# Patient Record
Sex: Female | Born: 1945 | Hispanic: Yes | Marital: Married | Smoking: Never smoker
Health system: Southern US, Community
[De-identification: ages and names within clinical notes are randomized; demographics above are authoritative.]

## PROBLEM LIST (undated history)

## (undated) DIAGNOSIS — K219 Gastro-esophageal reflux disease without esophagitis: Secondary | ICD-10-CM

## (undated) DIAGNOSIS — I471 Supraventricular tachycardia, unspecified: Secondary | ICD-10-CM

## (undated) DIAGNOSIS — E785 Hyperlipidemia, unspecified: Secondary | ICD-10-CM

## (undated) DIAGNOSIS — D447 Neoplasm of uncertain behavior of aortic body and other paraganglia: Secondary | ICD-10-CM

## (undated) DIAGNOSIS — M810 Age-related osteoporosis without current pathological fracture: Secondary | ICD-10-CM

## (undated) DIAGNOSIS — I10 Essential (primary) hypertension: Secondary | ICD-10-CM

## (undated) DIAGNOSIS — F39 Unspecified mood [affective] disorder: Secondary | ICD-10-CM

## (undated) DIAGNOSIS — D446 Neoplasm of uncertain behavior of carotid body: Secondary | ICD-10-CM

## (undated) DIAGNOSIS — G459 Transient cerebral ischemic attack, unspecified: Secondary | ICD-10-CM

## (undated) DIAGNOSIS — D329 Benign neoplasm of meninges, unspecified: Secondary | ICD-10-CM

## (undated) DIAGNOSIS — G453 Amaurosis fugax: Secondary | ICD-10-CM

## (undated) HISTORY — DX: Neoplasm of uncertain behavior of aortic body and other paraganglia: D44.7

## (undated) HISTORY — DX: Gastro-esophageal reflux disease without esophagitis: K21.9

## (undated) HISTORY — DX: Amaurosis fugax: G45.3

## (undated) HISTORY — DX: Supraventricular tachycardia: I47.1

## (undated) HISTORY — DX: Age-related osteoporosis without current pathological fracture: M81.0

## (undated) HISTORY — PX: IR TRANSCATH/EMBOLIZ: IMG695

## (undated) HISTORY — DX: Unspecified mood (affective) disorder: F39

## (undated) HISTORY — PX: HERNIA REPAIR: SHX51

## (undated) HISTORY — DX: Neoplasm of uncertain behavior of carotid body: D44.6

## (undated) HISTORY — DX: Supraventricular tachycardia, unspecified: I47.10

## (undated) HISTORY — DX: Transient cerebral ischemic attack, unspecified: G45.9

## (undated) HISTORY — DX: Hyperlipidemia, unspecified: E78.5

## (undated) HISTORY — PX: CHOLECYSTECTOMY: SHX55

---

## 2011-09-19 DIAGNOSIS — I10 Essential (primary) hypertension: Secondary | ICD-10-CM | POA: Insufficient documentation

## 2013-09-19 DIAGNOSIS — K219 Gastro-esophageal reflux disease without esophagitis: Secondary | ICD-10-CM | POA: Insufficient documentation

## 2014-10-02 DIAGNOSIS — F32A Depression, unspecified: Secondary | ICD-10-CM | POA: Insufficient documentation

## 2014-10-02 DIAGNOSIS — F419 Anxiety disorder, unspecified: Secondary | ICD-10-CM | POA: Insufficient documentation

## 2015-02-15 DIAGNOSIS — E7800 Pure hypercholesterolemia, unspecified: Secondary | ICD-10-CM | POA: Insufficient documentation

## 2016-06-13 DIAGNOSIS — D329 Benign neoplasm of meninges, unspecified: Secondary | ICD-10-CM | POA: Insufficient documentation

## 2017-12-21 ENCOUNTER — Other Ambulatory Visit: Payer: Self-pay

## 2017-12-21 ENCOUNTER — Emergency Department (HOSPITAL_BASED_OUTPATIENT_CLINIC_OR_DEPARTMENT_OTHER)
Admission: EM | Admit: 2017-12-21 | Discharge: 2017-12-21 | Disposition: A | Discharge status: Home / Self Care | Payer: Medicare Other | Attending: Emergency Medicine | Admitting: Emergency Medicine

## 2017-12-21 ENCOUNTER — Emergency Department (HOSPITAL_BASED_OUTPATIENT_CLINIC_OR_DEPARTMENT_OTHER): Payer: Medicare Other

## 2017-12-21 ENCOUNTER — Encounter (HOSPITAL_BASED_OUTPATIENT_CLINIC_OR_DEPARTMENT_OTHER): Payer: Self-pay

## 2017-12-21 DIAGNOSIS — G453 Amaurosis fugax: Secondary | ICD-10-CM | POA: Insufficient documentation

## 2017-12-21 DIAGNOSIS — I10 Essential (primary) hypertension: Secondary | ICD-10-CM | POA: Diagnosis not present

## 2017-12-21 DIAGNOSIS — H53132 Sudden visual loss, left eye: Secondary | ICD-10-CM | POA: Diagnosis present

## 2017-12-21 HISTORY — DX: Essential (primary) hypertension: I10

## 2017-12-21 HISTORY — DX: Benign neoplasm of meninges, unspecified: D32.9

## 2017-12-21 LAB — I-STAT CHEM 8, ED
BUN: 8 mg/dL (ref 6–20)
CALCIUM ION: 1.11 mmol/L — AB (ref 1.15–1.40)
Chloride: 105 mmol/L (ref 101–111)
Creatinine, Ser: 0.5 mg/dL (ref 0.44–1.00)
Glucose, Bld: 81 mg/dL (ref 65–99)
HEMATOCRIT: 38 % (ref 36.0–46.0)
HEMOGLOBIN: 12.9 g/dL (ref 12.0–15.0)
Potassium: 3.9 mmol/L (ref 3.5–5.1)
SODIUM: 141 mmol/L (ref 135–145)
TCO2: 24 mmol/L (ref 22–32)

## 2017-12-21 LAB — TROPONIN I

## 2017-12-21 LAB — CBC
HCT: 38.8 % (ref 36.0–46.0)
Hemoglobin: 12.7 g/dL (ref 12.0–15.0)
MCH: 31.8 pg (ref 26.0–34.0)
MCHC: 32.7 g/dL (ref 30.0–36.0)
MCV: 97.2 fL (ref 78.0–100.0)
PLATELETS: 166 10*3/uL (ref 150–400)
RBC: 3.99 MIL/uL (ref 3.87–5.11)
RDW: 13.9 % (ref 11.5–15.5)
WBC: 5.4 10*3/uL (ref 4.0–10.5)

## 2017-12-21 LAB — DIFFERENTIAL
BASOS ABS: 0 10*3/uL (ref 0.0–0.1)
Basophils Relative: 0 %
EOS ABS: 0.2 10*3/uL (ref 0.0–0.7)
Eosinophils Relative: 3 %
LYMPHS PCT: 42 %
Lymphs Abs: 2.3 10*3/uL (ref 0.7–4.0)
Monocytes Absolute: 0.5 10*3/uL (ref 0.1–1.0)
Monocytes Relative: 9 %
NEUTROS PCT: 46 %
Neutro Abs: 2.4 10*3/uL (ref 1.7–7.7)

## 2017-12-21 LAB — CBG MONITORING, ED: Glucose-Capillary: 75 mg/dL (ref 65–99)

## 2017-12-21 LAB — APTT: APTT: 34 s (ref 24–36)

## 2017-12-21 LAB — TSH: TSH: 2.275 u[IU]/mL (ref 0.350–4.500)

## 2017-12-21 LAB — PROTIME-INR
INR: 0.98
PROTHROMBIN TIME: 12.9 s (ref 11.4–15.2)

## 2017-12-21 NOTE — ED Provider Notes (Signed)
[facility] EMERGENCY DEPARTMENT Provider Note   CSN: [id number] Arrival date & time: 12/21/17  1127     History   Chief Complaint Chief Complaint  Patient presents with  . Visual Field Change    HPI [name] is a 72 y.o. female.  Patient is a 72 year old female presenting with new onset transient left eye blindness for 10 minutes.  PMH significant for HTN, depression, meningioma.  Patient is Spanish-speaking only and history was obtained by daughter who is present.  Patient was at home sitting on into bed yesterday evening when she suddenly experienced complete black-out of her left eye.  She also had a generalized weakness during the onset.  Blindness resolved after approximately 10 minutes.  Patient's daughter who was present during the event noted patient is back to baseline without dysarthria or confused state following the event.  Due to resolution of symptoms, patient's daughter waited till this morning to contact PCP who instructed patient to go to ED for further evaluation for possible stroke.  Patient states she is back at her baseline but does have some left eye blurriness to vision.  She does not wear contacts or glasses.  She denies prior history of stroke or TIA.  Daughter states patient does have a known meningioma on the right side of her brain.  She has taken her medications this morning for her blood pressure prior to arrival.  She is a non-smoker and denies alcohol or illicit drug use.  She denies fevers or chills, chest pain, shortness of breath, nausea or vomiting, abdominal pain, headache, motor weakness or decreased sensation, eye pain, neck stiffness, or photophobia.      Past Medical History:  Diagnosis Date  . Hypertension   . Meningioma ([place])     There are no active problems to display for this patient.   Past Surgical History:  Procedure Laterality Date  . CHOLECYSTECTOMY      OB History    No data available       Home  Medications    Prior to Admission medications   Not on File    Family History No family history on file.  Social History Social History   Tobacco Use  . Smoking status: Never Smoker  . Smokeless tobacco: Never Used  Substance Use Topics  . Alcohol use: No    Frequency: Never  . Drug use: No     Allergies   Patient has no known allergies.   Review of Systems Review of Systems  Constitutional: Negative for chills, fatigue and fever.  HENT: Negative for congestion, ear pain and sore throat.   Eyes: Positive for visual disturbance. Negative for photophobia, pain, discharge, redness and itching.  Respiratory: Negative for cough and shortness of breath.   Cardiovascular: Negative for chest pain and palpitations.  Gastrointestinal: Negative for abdominal pain and vomiting.  Genitourinary: Negative for dysuria and hematuria.  Musculoskeletal: Negative for arthralgias and back pain.  Skin: Negative for color change and rash.  Neurological: Positive for weakness. Negative for dizziness, tremors, seizures, syncope, facial asymmetry, speech difficulty, light-headedness, numbness and headaches.  Psychiatric/Behavioral: Negative for confusion. The patient is not nervous/anxious.   All other systems reviewed and are negative.    Physical Exam Updated Vital Signs BP (!) 161/65   Pulse (!) 57   Temp 98.4 F (36.9 C) (Oral)   Resp 15   Ht 5' (1.524 m)   Wt 68 kg (150 lb)   SpO2 95%   BMI 29.29  kg/m   Physical Exam  Constitutional: She is oriented to person, place, and time. She appears well-developed and well-nourished. No distress.  HENT:  Head: Normocephalic and atraumatic.  Eyes: Conjunctivae are normal.  Neck: Normal range of motion. Neck supple.  Cardiovascular: Normal rate and regular rhythm.  No murmur heard. Pulmonary/Chest: Effort normal and breath sounds normal. No respiratory distress.  Abdominal: Soft. There is no tenderness.  Musculoskeletal: She exhibits  no edema.  Neurological: She is alert and oriented to person, place, and time.  CNII-XII intact, no dysarthria, fine motor intact  Skin: Skin is warm and dry. Capillary refill takes less than 2 seconds.  Psychiatric: She has a normal mood and affect. Her behavior is normal.  Nursing note and vitals reviewed.    ED Treatments / Results  Labs (all labs ordered are listed, but only abnormal results are displayed) Labs Reviewed  I-STAT CHEM 8, ED - Abnormal; Notable for the following components:      Result Value   Calcium, Ion 1.11 (*)    All other components within normal limits  PROTIME-INR  APTT  CBC  DIFFERENTIAL  TROPONIN I  TSH  CBG MONITORING, ED    EKG  EKG Interpretation None       Radiology Ct Head Wo Contrast  Result Date: 12/21/2017 CLINICAL DATA:  Total loss of vision and then blinking to left eye last night. Symptoms for 10 minutes. There is also left-sided weakness. EXAM: CT HEAD WITHOUT CONTRAST TECHNIQUE: Contiguous axial images were obtained from the base of the skull through the vertex without intravenous contrast. COMPARISON:  Brain MRI from [facility] 11/29/2016 FINDINGS: Brain: No findings along the optic pathways to explain symptoms of visual acuity. No infarct, hemorrhage, or hydrocephalus. There is cerebral volume loss that is mild for age. Right frontal pole convexity extra-axial appearing mass with stippled calcification measuring up to 17 mm, most consistent with meningioma. There is local mass effect without visible edema. Vascular: No hyperdense vessel or unexpected calcification. Skull: Normal. Negative for fracture or focal lesion. Sinuses/Orbits: Negative.  Left orbit is incompletely visualized. IMPRESSION: 1. No acute finding or explanation for visual symptoms. 2. ~17 mm right anterior frontal meningioma without detectable change from 11/29/2016. No evidence of brain edema. Electronically Signed   By: Monte Fantasia M.D.   On: 12/21/2017 12:34     Procedures Procedures (including critical care time)  Medications Ordered in ED Medications - No data to display   Initial Impression / Assessment and Plan / ED Course  I have reviewed the triage vital signs and the nursing notes.  Pertinent labs & imaging results that were available during my care of the patient were reviewed by me and considered in my medical decision making (see chart for details).    Patient is a 72 year old female presenting with new onset transient left eye blindness for 10 minutes.  PMH significant for HTN, depression, meningioma.  Patient is Spanish-speaking only and history was obtained by daughter who is present.  Patient presenting with history of transient left eye blindness evening prior to arrival.  She continues to have some blurry vision limited to left eye.  Vital signs significant for bradycardia 57, hypertensive at 183/64 improved to 161/65 without use of medications breathing 98% on room air.  Patient is afebrile.  Complete neuro exam performed without focal deficits.  Visual acuity appears to be intact.  Pupils are equal and round and reactive to light and accommodation.  Dysarthria is absent.  No meningeal signs.  We will proceed with CT head without contrast.  Differential includes stroke/TIA, amaurosis fugax.  CT head without contrast significant for 17 mm right anterior frontal meningioma without changes from prior imaging on 11/29/2016.  No evidence of acute findings.  Suspect patient sustained a left-sided amaurosis fugax.  Discussed with patient and daughter regarding need for PCP follow-up and establish with a neurologist.  Reviewed return precautions.  Patient and daughter in agreement discharge.  Final Clinical Impressions(s) / ED Diagnoses   Final diagnoses:  None    ED Discharge Orders    None       Dayton Bing, DO 12/21/17 1305    Jola Schmidt, MD 12/21/17 [phone]

## 2017-12-21 NOTE — ED Triage Notes (Signed)
Per daughter pt with total loss of vision then blinking to left eye last night approx 9pm-lasted approx 10 min-also felt weakness-denies pain-NAD-steady gait-daughter with pt/interpreting

## 2017-12-21 NOTE — Discharge Instructions (Signed)
Follow-up with your primary care physician within the week.  Continue taking her medications as instructed.  You should be evaluated by a neurologist due to your symptoms.  I have given you the contact information for [facility] neurology.

## 2017-12-29 DIAGNOSIS — G459 Transient cerebral ischemic attack, unspecified: Secondary | ICD-10-CM | POA: Insufficient documentation

## 2018-05-25 DIAGNOSIS — D446 Neoplasm of uncertain behavior of carotid body: Secondary | ICD-10-CM | POA: Insufficient documentation

## 2018-09-14 HISTORY — PX: OTHER SURGICAL HISTORY: SHX169

## 2019-01-04 IMAGING — CT CT HEAD W/O CM
3 series · 16 of 47 positions shown, 19 images · non-contrast
Comparison: Brain MRI from [facility] 11/29/2016

CLINICAL DATA: Total loss of vision and then blinking to left eye
last night. Symptoms for 10 minutes. There is also left-sided
weakness.

EXAM:
CT HEAD WITHOUT CONTRAST
TECHNIQUE: Contiguous axial images were obtained from the base of the skull
through the vertex without intravenous contrast.

[Series 2: head (person_name) (person_name) · axial · 0.39mm/px · z∈[-165,-35]mm · 10 of 32 slices shown, 13 images]
[im 3/32  brain]
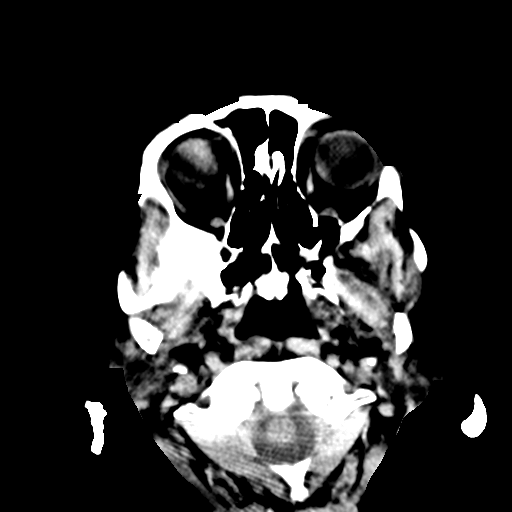
[im 3/32  bone]
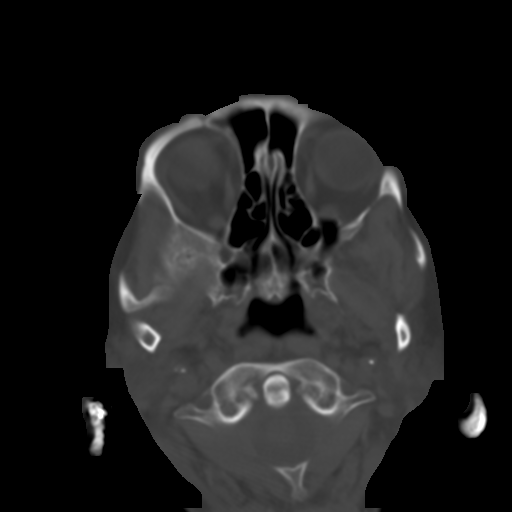
[im 6/32  brain]
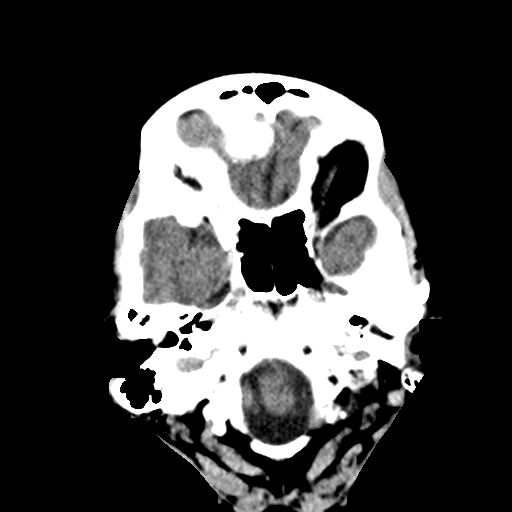
[im 9/32  brain]
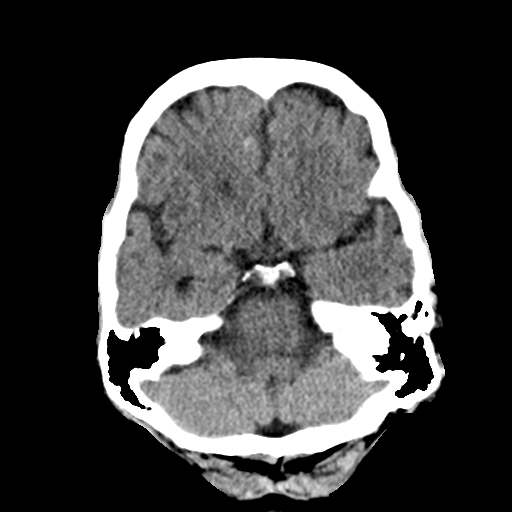
[im 11/32  brain]
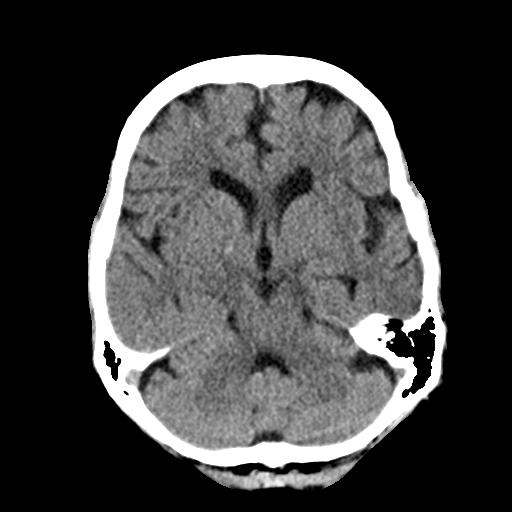
[im 14/32  brain]
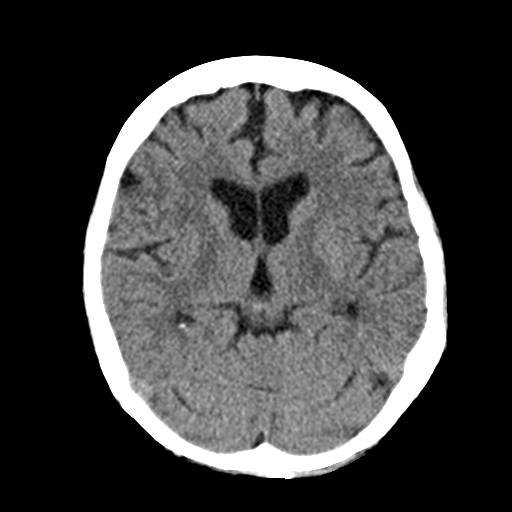
[im 14/32  bone]
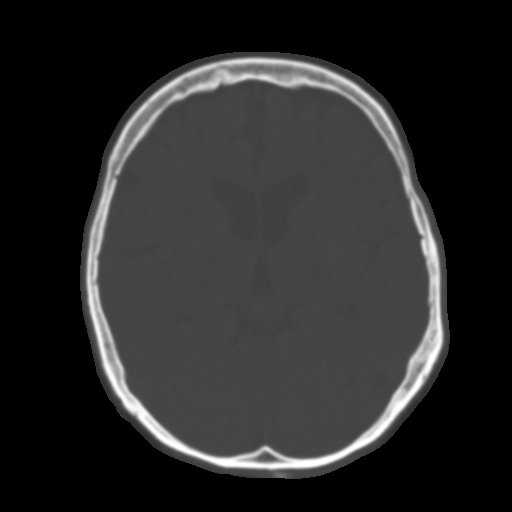
[im 18/32  brain]
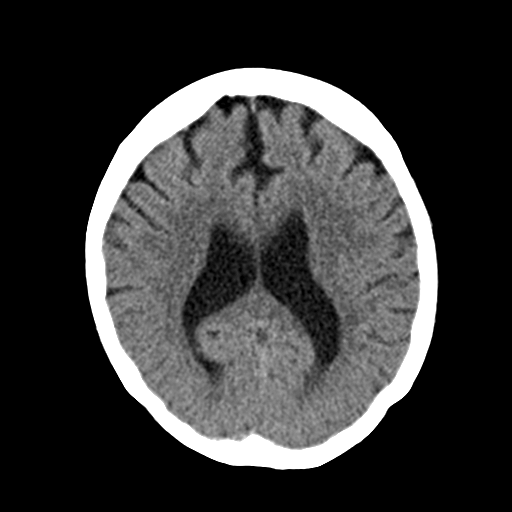
[im 21/32  brain]
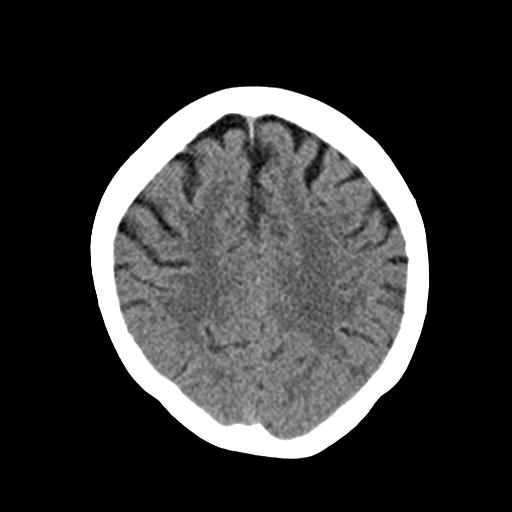
[im 24/32  brain]
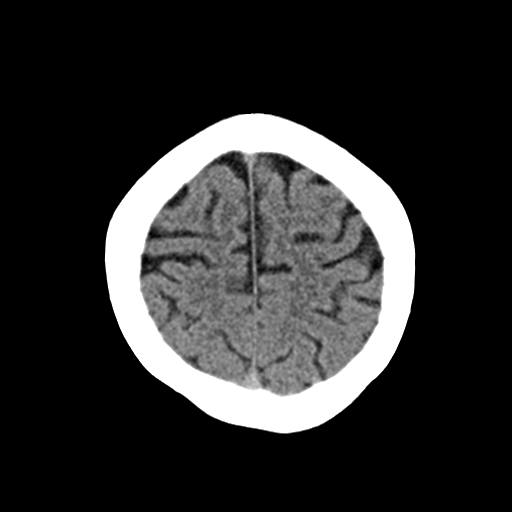
[im 26/32  brain]
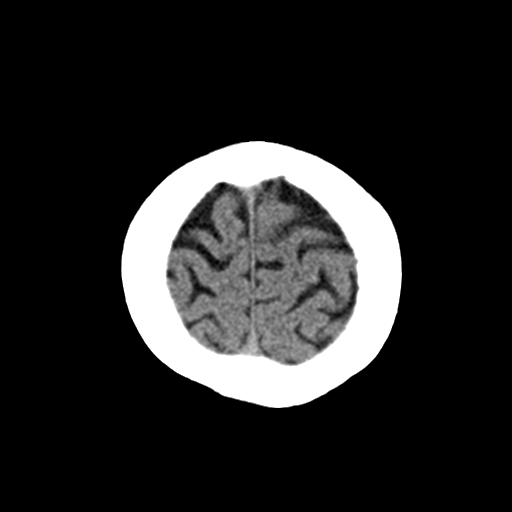
[im 26/32  bone]
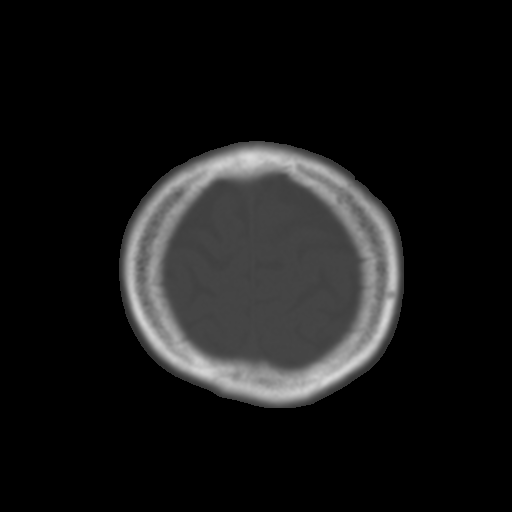
[im 29/32  brain]
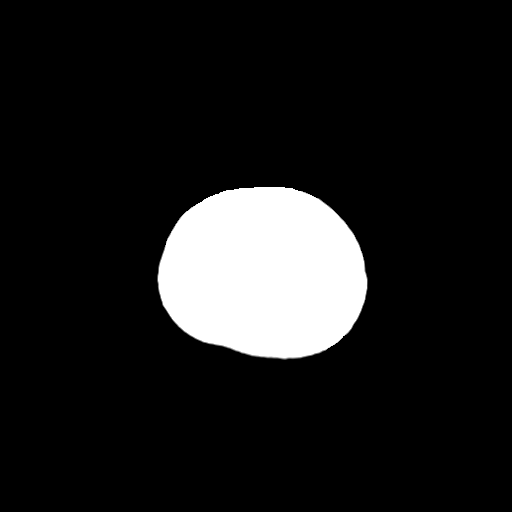

[Series 3: coronal soft · coronal · 0.30mm/px · 3 of 63 slices shown]
[im 21/63  brain]
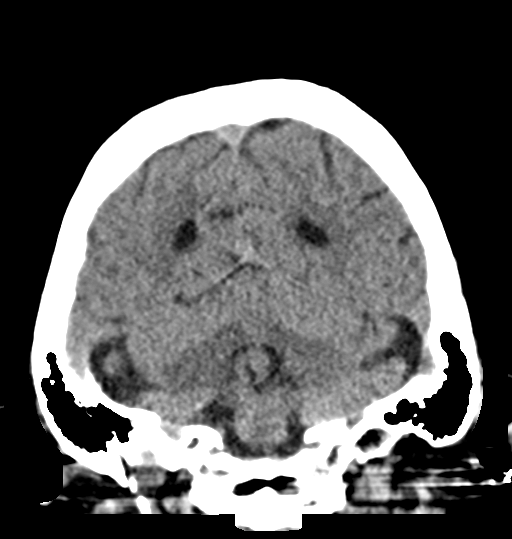
[im 28/63  brain]
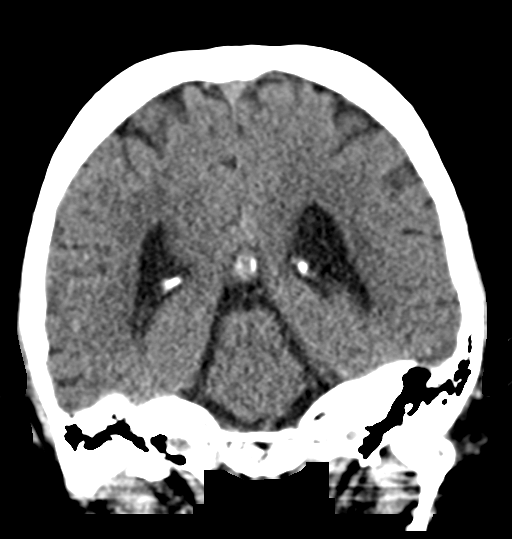
[im 35/63  brain]
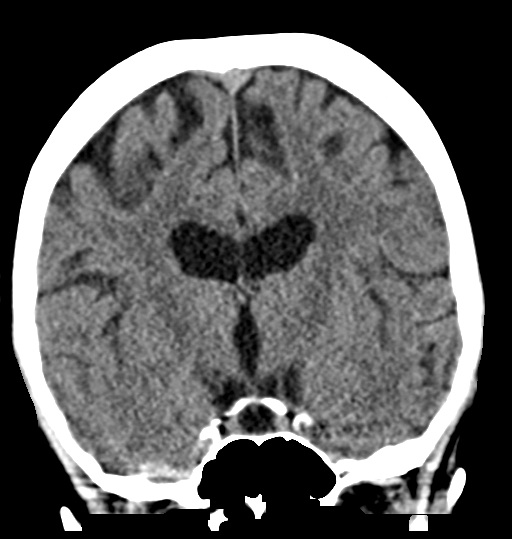

[Series 5: sag soft · sagittal · 0.31mm/px · 3 of 49 slices shown]
[im 17/49  brain]
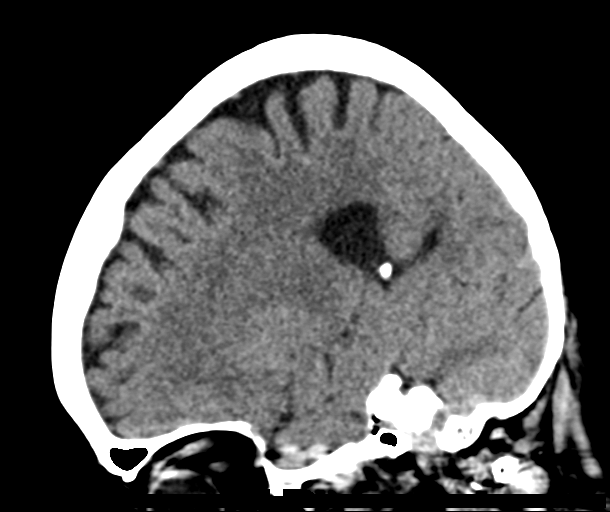
[im 25/49  brain]
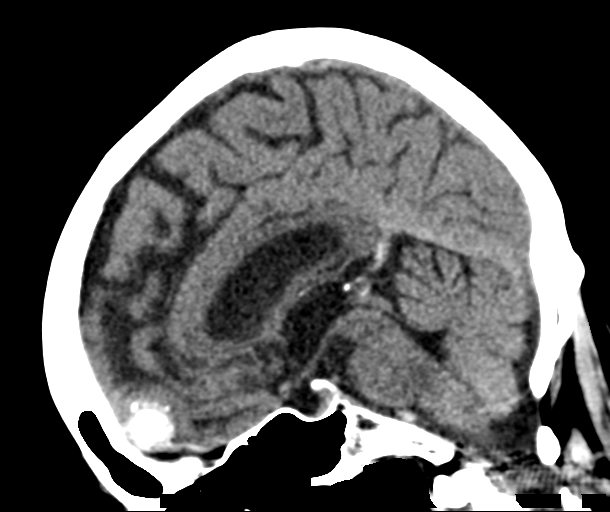
[im 33/49  brain]
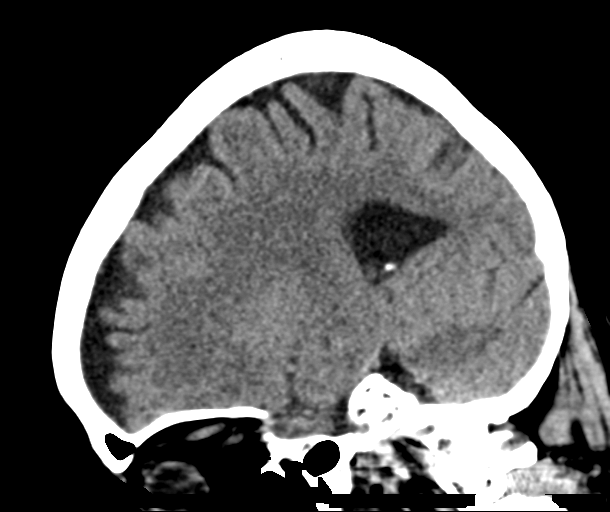

[16 of 47 positions shown; findings below may reference images not displayed]

FINDINGS: Brain: No findings along the optic pathways to explain symptoms of
visual acuity. No infarct, hemorrhage, or hydrocephalus. There is
cerebral volume loss that is mild for age. Right frontal pole
convexity extra-axial appearing mass with stippled calcification
measuring up to 17 mm, most consistent with meningioma. There is
local mass effect without visible edema.

Vascular: No hyperdense vessel or unexpected calcification.

Skull: Normal. Negative for fracture or focal lesion.

Sinuses/Orbits: Negative.  Left orbit is incompletely visualized.
IMPRESSION: 1. No acute finding or explanation for visual symptoms.
2. 
17 mm right anterior frontal meningioma without detectable
change from 11/29/2016. No evidence of brain edema.

## 2019-01-16 DIAGNOSIS — I471 Supraventricular tachycardia: Secondary | ICD-10-CM | POA: Insufficient documentation

## 2019-05-06 DIAGNOSIS — R600 Localized edema: Secondary | ICD-10-CM | POA: Insufficient documentation

## 2021-10-04 LAB — HM DEXA SCAN

## 2021-11-02 DIAGNOSIS — M810 Age-related osteoporosis without current pathological fracture: Secondary | ICD-10-CM | POA: Insufficient documentation

## 2022-07-17 ENCOUNTER — Telehealth: Payer: Self-pay | Admitting: Internal Medicine

## 2022-07-17 DIAGNOSIS — F39 Unspecified mood [affective] disorder: Secondary | ICD-10-CM | POA: Insufficient documentation

## 2022-07-17 DIAGNOSIS — G479 Sleep disorder, unspecified: Secondary | ICD-10-CM | POA: Insufficient documentation

## 2022-07-17 NOTE — Telephone Encounter (Signed)
Pt would like to know if ok per Dr. Larose Kells to est new pt with him, pt was recommended by Molli Posey who is currently a pt of Dr Larose Kells. Pt speaks spanish and prefers a provider that speaks spanish. Pt tel [phone] (Mr Molli Posey tel phone number- for updates)

## 2022-07-17 NOTE — Telephone Encounter (Signed)
Please advise once you return to office.

## 2022-07-22 NOTE — Telephone Encounter (Signed)
That is okay.  Thank you 

## 2022-07-22 NOTE — Telephone Encounter (Signed)
Okay to schedule NP appt. Thank you.  

## 2022-07-23 NOTE — Telephone Encounter (Signed)
Pt is scheduled for NP with [facility]. Done

## 2022-07-31 DIAGNOSIS — D447 Neoplasm of uncertain behavior of aortic body and other paraganglia: Secondary | ICD-10-CM | POA: Insufficient documentation

## 2022-07-31 DIAGNOSIS — G453 Amaurosis fugax: Secondary | ICD-10-CM | POA: Insufficient documentation

## 2022-08-07 ENCOUNTER — Encounter: Payer: Self-pay | Admitting: General Practice

## 2022-08-15 ENCOUNTER — Encounter: Payer: Self-pay | Admitting: Internal Medicine

## 2022-08-15 ENCOUNTER — Ambulatory Visit (INDEPENDENT_AMBULATORY_CARE_PROVIDER_SITE_OTHER): Payer: 59 | Admitting: Internal Medicine

## 2022-08-15 VITALS — BP 122/68 | HR 53 | Temp 98.1°F | Resp 16 | Ht 59.0 in | Wt 156.4 lb

## 2022-08-15 DIAGNOSIS — Z09 Encounter for follow-up examination after completed treatment for conditions other than malignant neoplasm: Secondary | ICD-10-CM | POA: Insufficient documentation

## 2022-08-15 DIAGNOSIS — I10 Essential (primary) hypertension: Secondary | ICD-10-CM | POA: Diagnosis not present

## 2022-08-15 DIAGNOSIS — F329 Major depressive disorder, single episode, unspecified: Secondary | ICD-10-CM | POA: Diagnosis not present

## 2022-08-15 DIAGNOSIS — E559 Vitamin D deficiency, unspecified: Secondary | ICD-10-CM

## 2022-08-15 DIAGNOSIS — E7800 Pure hypercholesterolemia, unspecified: Secondary | ICD-10-CM

## 2022-08-15 DIAGNOSIS — Z23 Encounter for immunization: Secondary | ICD-10-CM

## 2022-08-15 DIAGNOSIS — K219 Gastro-esophageal reflux disease without esophagitis: Secondary | ICD-10-CM

## 2022-08-15 DIAGNOSIS — M810 Age-related osteoporosis without current pathological fracture: Secondary | ICD-10-CM | POA: Diagnosis not present

## 2022-08-15 NOTE — Patient Instructions (Addendum)
Continue the present medications.  Vaccines I recommend:  Covid booster Tdap (tetanus)  Check the  blood pressure regularly BP GOAL is between 110/65 and  135/85. If it is consistently higher or lower, let me know  HEALTHY SLEEP  Consider melatonin over-the-counter at nighttime, 4 to 8 mg  Sleep hygiene: Basic rules for a good night's sleep  Sleep only as much as you need to feel rested and then get out of bed  Keep a regular sleep schedule  Avoid forcing sleep  Exercise regularly for at least 20 minutes, preferably 4 to 5 hours before bedtime  Avoid caffeinated beverages after lunch  Avoid alcohol near bedtime: no "night cap"  Avoid smoking, especially in the evening  Do not go to bed hungry  Adjust bedroom environment  Avoid prolonged use of light-emitting screens before bedtime   Deal with your worries before bedtime      GO TO THE LAB : Get the blood work     GO TO [place], [place] back for a checkup in 2 to 3 months

## 2022-08-15 NOTE — Assessment & Plan Note (Signed)
New patient Labs  from 07/15/2022: 07/15/2022: Potassium 3.3, creatinine 0.5, LFTs normal.  Sed rate normal. CBC normal.  HTN: Reports a long history of hypertension, on multiple medications including amlodipine, chlorthalidone, Lasix, magnesium, metoprolol and Aldactone. She has been seen by nephrology and cardiology in the past. BP today is good, in the ambulatory setting reports that from time to time BP go as high as 180. Plan: Recheck BMP, RF meds as needed. High cholesterol: On atorvastatin, check FLP. Depression: Lost a brother to a MI last month, her sister is ill, her mother is 6 years old and not doing well.  Additionally few weeks ago she came back from visiting [place] and unexpectedly her husband's family moved him to [place]  w/o notifying her reason why is now living in [place] with her daughter and son-in-law. Not interested in antidepressants.  Denies suicidal ideas. Insomnia: Rec when melatonin. GERD: Sees GI regularly, had a barium study February 2023, some evidence of dysmotility, has EGD scheduled for December. Osteoporosis: Per chart review, last Reclast injection on 03/17/2022 Vitamin D deficiency: On OTCs, checking levels Preventive care: Flu shot today, COVID-vaccine recommended. Recommend to keep her current to specialists for now. RTC 2 to 3 months.

## 2022-08-15 NOTE — Progress Notes (Signed)
Subjective:    Patient ID: [place] [name], female    DOB: [birth year], 76 y.o.   MRN: [id number]  DOS:  08/15/2022 Type of visit - description: New patient, referred by his son-in-law.  The patient used to live in [name], medical care was with doctors at [name]. [name] to a number of circumstances she is now in [name].  Extensive chart review. I assessed all her chronic medical problems  In general feeling well physically except for persistent right knee pain. She admits to some depression, but does not like to take any medication. Having some issues with insomnia.  Review of Systems See above   Past Medical History:  Diagnosis Date   Amaurosis fugax    Carotid body tumor (HCC)    GERD (gastroesophageal reflux disease)    Hyperlipidemia    Hypertension    Meningioma (HCC)    Mood disorder (HCC)    Osteoporosis    Paraganglioma (HCC)    SVT (supraventricular tachycardia) (HCC)    TIA (transient ischemic attack)     Past Surgical History:  Procedure Laterality Date   CHOLECYSTECTOMY     HERNIA REPAIR     IR TRANSCATH/EMBOLIZ     OTHER SURGICAL HISTORY  09/14/2018   carotid lesion   Social History   Socioeconomic History   Marital status: Married    Spouse name: Not on file   Number of children: 5   Years of education: Not on file   Highest education level: Not on file  Occupational History   Not on file  Tobacco Use   Smoking status: Never   Smokeless tobacco: Never  Substance and Sexual Activity   Alcohol use: No   Drug use: No   Sexual activity: Not on file  Other Topics Concern   Not on file  Social History Narrative   Original from [name]   Moved to [name] 1999, moved  [name] 06-2022 to live w/ daughter and son in [name])    Married, don't have children in common , husband living in [name] as of 07-2022    2 son, 2 daughters   72 a son   Social Determinants of Radio broadcast assistant Strain: Not on file  Food  Insecurity: Not on file  Transportation Needs: Not on file  Physical Activity: Not on file  Stress: Not on file  Social Connections: Not on file  Intimate Partner Violence: Not on file   Family History  Problem Relation Age of Onset   CAD Father 14   Colon cancer Sister    Breast cancer Sister    Head & neck cancer Brother    CAD Brother 73       MI    Current Outpatient Medications  Medication Instructions   amLODipine (NORVASC) 10 mg, Oral, Daily   atorvastatin (LIPITOR) 20 mg, Oral, Daily   chlorthalidone (HYGROTON) 25 MG tablet 1 tablet, Oral, Daily   Cholecalciferol 2,000 Units, Oral, Daily   diclofenac Sodium (VOLTAREN) 2 g, Topical, 3 times daily PRN   furosemide (LASIX) 20 mg, Oral, Daily   magnesium oxide (MAG-OX) 400 MG tablet 1 tablet, Oral, Daily   metoprolol succinate (TOPROL-XL) 50 mg, Oral, 2 times daily   omeprazole (PRILOSEC) 20 mg, Oral, Daily   spironolactone (ALDACTONE) 25 mg, Oral, Daily   zoledronic acid (RECLAST) 5 mg, Intravenous,  Once       Objective:   Physical Exam BP 122/68  Pulse (!) 53   Temp 98.1 F (36.7 C) (Oral)   Resp 16   Ht '4\' 11"'$  (1.499 m)   Wt 156 lb 6 oz (70.9 kg)   SpO2 96%   BMI 31.58 kg/m  General:   Well developed, NAD, BMI noted. HEENT:  Normocephalic . Face symmetric, atraumatic Lungs:  CTA B Normal respiratory effort, no intercostal retractions, no accessory muscle use. Heart: RRR,  no murmur.  Lower extremities: Has a brace and the R knee. Skin: Not pale. Not jaundice Neurologic:  alert & oriented X3.  Speech normal, gait appropriate for age and unassisted Psych--  Cognition and judgment appear intact.  Cooperative with normal attention span and concentration.  Behavior appropriate. No anxious or depressed appearing.      Assessment     ASSESSMENT  (new patient, 07-2022, refer by his son-in-law) HTN High cholesterol Depression Osteoporosis Vitamin D deficiency GERD, abnormal barium study, EGD  recommended 10-2022 Carotid body tumor s/p excision H/o meningioma anterior cranial fossa , declined surgery, elected observation H/o palpitations FH CAD, Breast and colon cancer   PLAN New patient Labs  from 07/15/2022: 07/15/2022: Potassium 3.3, creatinine 0.5, LFTs normal.  Sed rate normal. CBC normal.  HTN: Reports a long history of hypertension, on multiple medications including amlodipine, chlorthalidone, Lasix, magnesium, metoprolol and Aldactone. She has been seen by nephrology and cardiology in the past. BP today is good, in the ambulatory setting reports that from time to time BP go as high as 180. Plan: Recheck BMP, RF meds as needed. High cholesterol: On atorvastatin, check FLP. Depression: Lost a brother to a MI last month, her sister is ill, her mother is 72 years old and not doing well.  Additionally few weeks ago she came back from visiting [name] and unexpectedly her husband's family moved him to [place]  w/o notifying her reason why is now living in [name] with her daughter and son-in-law. Not interested in antidepressants.  Denies suicidal ideas. Insomnia: Rec when melatonin. GERD: Sees GI regularly, had a barium study February 2023, some evidence of dysmotility, has EGD scheduled for December. Osteoporosis: Per chart review, last Reclast injection on 03/17/2022 Vitamin D deficiency: On OTCs, checking levels Preventive care: Flu shot today, COVID-vaccine recommended. Recommend to keep her current to specialists for now. RTC 2 to 3 months.

## 2022-08-16 LAB — BASIC METABOLIC PANEL
BUN/Creatinine Ratio: 31 (calc) — ABNORMAL HIGH (ref 6–22)
BUN: 16 mg/dL (ref 7–25)
CO2: 31 mmol/L (ref 20–32)
Calcium: 9.9 mg/dL (ref 8.6–10.4)
Chloride: 94 mmol/L — ABNORMAL LOW (ref 98–110)
Creat: 0.51 mg/dL — ABNORMAL LOW (ref 0.60–1.00)
Glucose, Bld: 106 mg/dL — ABNORMAL HIGH (ref 65–99)
Potassium: 3.7 mmol/L (ref 3.5–5.3)
Sodium: 135 mmol/L (ref 135–146)

## 2022-08-16 LAB — VITAMIN D 25 HYDROXY (VIT D DEFICIENCY, FRACTURES): Vit D, 25-Hydroxy: 36 ng/mL (ref 30–100)

## 2022-08-16 LAB — LIPID PANEL
Cholesterol: 146 mg/dL (ref <200)
HDL: 63 mg/dL (ref >=50)
LDL Cholesterol (Calc): 64 mg/dL (calc)
Non-HDL Cholesterol (Calc): 83 mg/dL (calc) (ref <130)
Total CHOL/HDL Ratio: 2.3 (calc) (ref <5.0)
Triglycerides: 104 mg/dL (ref <150)

## 2022-08-27 ENCOUNTER — Encounter: Payer: Self-pay | Admitting: Internal Medicine

## 2022-11-14 ENCOUNTER — Encounter: Payer: Self-pay | Admitting: Internal Medicine

## 2022-11-14 ENCOUNTER — Ambulatory Visit: Payer: 59 | Admitting: Internal Medicine

## 2022-11-27 ENCOUNTER — Telehealth: Payer: Self-pay | Admitting: Internal Medicine

## 2022-11-27 NOTE — Telephone Encounter (Signed)
Pt's daughter was wanting to know if we could waive her no show fee. Her mother's mother passed away and she flew straight to [place] and has not come back yet. They stated money is very tight and she does not have the means to pay $50. She will reschedule when she has come back.

## 2022-11-28 NOTE — Telephone Encounter (Signed)
Yes, sorry to hear about her loss

## 2022-11-28 NOTE — Telephone Encounter (Signed)
Please advise 

## 2023-09-21 ENCOUNTER — Emergency Department (HOSPITAL_COMMUNITY): Payer: 59

## 2023-09-21 ENCOUNTER — Telehealth: Payer: Self-pay

## 2023-09-21 ENCOUNTER — Emergency Department (HOSPITAL_COMMUNITY)
Admission: EM | Admit: 2023-09-21 | Discharge: 2023-09-22 | Disposition: A | Discharge status: Home / Self Care | Payer: 59

## 2023-09-21 DIAGNOSIS — R109 Unspecified abdominal pain: Secondary | ICD-10-CM | POA: Diagnosis not present

## 2023-09-21 DIAGNOSIS — R531 Weakness: Secondary | ICD-10-CM | POA: Diagnosis not present

## 2023-09-21 DIAGNOSIS — Z7982 Long term (current) use of aspirin: Secondary | ICD-10-CM | POA: Diagnosis not present

## 2023-09-21 DIAGNOSIS — Z20822 Contact with and (suspected) exposure to covid-19: Secondary | ICD-10-CM | POA: Diagnosis not present

## 2023-09-21 DIAGNOSIS — Z79899 Other long term (current) drug therapy: Secondary | ICD-10-CM | POA: Insufficient documentation

## 2023-09-21 DIAGNOSIS — R4781 Slurred speech: Secondary | ICD-10-CM | POA: Insufficient documentation

## 2023-09-21 DIAGNOSIS — I10 Essential (primary) hypertension: Secondary | ICD-10-CM | POA: Insufficient documentation

## 2023-09-21 DIAGNOSIS — R200 Anesthesia of skin: Secondary | ICD-10-CM | POA: Insufficient documentation

## 2023-09-21 DIAGNOSIS — R110 Nausea: Secondary | ICD-10-CM | POA: Diagnosis not present

## 2023-09-21 DIAGNOSIS — R42 Dizziness and giddiness: Secondary | ICD-10-CM | POA: Diagnosis present

## 2023-09-21 DIAGNOSIS — N83202 Unspecified ovarian cyst, left side: Secondary | ICD-10-CM | POA: Insufficient documentation

## 2023-09-21 LAB — URINALYSIS, ROUTINE W REFLEX MICROSCOPIC
Bacteria, UA: NONE SEEN
Bilirubin Urine: NEGATIVE
Glucose, UA: NEGATIVE mg/dL
Hgb urine dipstick: NEGATIVE
Ketones, ur: NEGATIVE mg/dL
Nitrite: NEGATIVE
Protein, ur: NEGATIVE mg/dL
Specific Gravity, Urine: >1.046 — ABNORMAL HIGH (ref 1.005–1.030)
pH: 6 (ref 5.0–8.0)

## 2023-09-21 LAB — HEPATIC FUNCTION PANEL
ALT: 25 U/L (ref 0–44)
AST: 26 U/L (ref 15–41)
Albumin: 3.8 g/dL (ref 3.5–5.0)
Alkaline Phosphatase: 50 U/L (ref 38–126)
Bilirubin, Direct: 0.2 mg/dL (ref 0.0–0.2)
Indirect Bilirubin: 0.8 mg/dL (ref 0.3–0.9)
Total Bilirubin: 1 mg/dL (ref <1.2)
Total Protein: 7.1 g/dL (ref 6.5–8.1)

## 2023-09-21 LAB — CBC
HCT: 40.4 % (ref 36.0–46.0)
Hemoglobin: 13.2 g/dL (ref 12.0–15.0)
MCH: 31.7 pg (ref 26.0–34.0)
MCHC: 32.7 g/dL (ref 30.0–36.0)
MCV: 96.9 fL (ref 80.0–100.0)
Platelets: 138 10*3/uL — ABNORMAL LOW (ref 150–400)
RBC: 4.17 MIL/uL (ref 3.87–5.11)
RDW: 12.8 % (ref 11.5–15.5)
WBC: 8.1 10*3/uL (ref 4.0–10.5)
nRBC: 0 % (ref 0.0–0.2)

## 2023-09-21 LAB — BASIC METABOLIC PANEL
Anion gap: 11 (ref 5–15)
BUN: 14 mg/dL (ref 8–23)
CO2: 26 mmol/L (ref 22–32)
Calcium: 9.3 mg/dL (ref 8.9–10.3)
Chloride: 101 mmol/L (ref 98–111)
Creatinine, Ser: 0.52 mg/dL (ref 0.44–1.00)
GFR, Estimated: >60 mL/min (ref >60)
Glucose, Bld: 95 mg/dL (ref 70–99)
Potassium: 4.4 mmol/L (ref 3.5–5.1)
Sodium: 138 mmol/L (ref 135–145)

## 2023-09-21 LAB — RESP PANEL BY RT-PCR (RSV, FLU A&B, COVID)  RVPGX2
Influenza A by PCR: NEGATIVE
Influenza B by PCR: NEGATIVE
Resp Syncytial Virus by PCR: NEGATIVE
SARS Coronavirus 2 by RT PCR: NEGATIVE

## 2023-09-21 LAB — LIPASE, BLOOD: Lipase: 41 U/L (ref 11–51)

## 2023-09-21 LAB — TROPONIN I (HIGH SENSITIVITY)
Troponin I (High Sensitivity): 6 ng/L (ref <18)
Troponin I (High Sensitivity): 8 ng/L (ref <18)

## 2023-09-21 LAB — TSH: TSH: 2.081 u[IU]/mL (ref 0.350–4.500)

## 2023-09-21 LAB — CK: Total CK: 70 U/L (ref 38–234)

## 2023-09-21 LAB — CBG MONITORING, ED: Glucose-Capillary: 95 mg/dL (ref 70–99)

## 2023-09-21 MED ORDER — ONDANSETRON HCL 4 MG/2ML IJ SOLN
4.0000 mg | Freq: Once | INTRAMUSCULAR | Status: AC
  Administered 2023-09-21: 4 mg via INTRAVENOUS
  Filled 2023-09-21: qty 2

## 2023-09-21 MED ORDER — HYDRALAZINE HCL 20 MG/ML IJ SOLN
5.0000 mg | Freq: Once | INTRAMUSCULAR | Status: DC
  Filled 2023-09-21: qty 1

## 2023-09-21 MED ORDER — MECLIZINE HCL 25 MG PO TABS
25.0000 mg | ORAL_TABLET | Freq: Once | ORAL | Status: AC
  Administered 2023-09-21: 25 mg via ORAL
  Filled 2023-09-21: qty 1

## 2023-09-21 MED ORDER — IOHEXOL 350 MG/ML SOLN
75.0000 mL | Freq: Once | INTRAVENOUS | Status: AC | PRN
  Administered 2023-09-21: 75 mL via INTRAVENOUS

## 2023-09-21 NOTE — ED Triage Notes (Signed)
Pt to ED via [facility] from home. Pt c/o dizziness and generalized weakness since yesterday at 3pm. Family reports slurred speech yesterday as well. Pt has hx of stroke. Pt c/o nausea and diarrhea. Pt also states her BP was elevated this morning and took her BP meds and stated it came down some and some symptoms resolved after taking medication. Pt does endorse L hand numbness this morning, but denies any numbness / tingling upon arrival to ED. No facial droop noted in triage. Pt denies any pain.   Pt primarily spanish speaking. Interpreter used in triage.    EMS: 40s HR 162/92 128 CBG 97% RA

## 2023-09-21 NOTE — ED Notes (Signed)
CBG 95 

## 2023-09-21 NOTE — ED Notes (Signed)
Alerted RN about pts low HR, She was aware and stated it has been low.

## 2023-09-21 NOTE — Telephone Encounter (Signed)
Initial Comment Caller states her mother is feeling dizzy with fluctuating blood pressure. Her BP right now is 145/68. Translation No Nurse Assessment Nurse: Margaretha Sheffield, RN, Cala Bradford Date/Time (Eastern Time): 09/21/2023 1:49:25 PM Confirm and document reason for call. If symptomatic, describe symptoms. ---Caller reports her mom having dizziness x yesterday. Coming/going for a while but constant x yesterday. Does the patient have any new or worsening symptoms? ---Yes Will a triage be completed? ---Yes Related visit to physician within the last 2 weeks? ---No Does the PT have any chronic conditions? (i.e. diabetes, asthma, this includes High risk factors for pregnancy, etc.) ---Yes List chronic conditions. ---HTN, osteoporosis, arthritis Is this a behavioral health or substance abuse call? ---No Guidelines Guideline Title Affirmed Question Affirmed Notes Nurse Date/Time (Eastern Time) Dizziness - Lightheadedness [1] Weakness (i.e., paralysis, loss of muscle strength) of the face, arm or leg on one side of the body AND [2] sudden onset AND [3] present now Margaretha Sheffield, RN, Cala Bradford 09/21/2023 1:52:41 PM PLEASE NOTE: All timestamps contained within this report are represented as [place] Standard Time. CONFIDENTIALTY NOTICE: This fax transmission is intended only for the addressee. It contains information that is legally privileged, confidential or otherwise protected from use or disclosure. If you are not the intended recipient, you are strictly prohibited from reviewing, disclosing, copying using or disseminating any of this information or taking any action in reliance on or regarding this information. If you have received this fax in error, please notify us immediately by telephone so that we can arrange for its return to [place]. Phone: [phone], Toll-Free: [phone], Fax: [phone] Page: 2 of 2 Call Id: [id number] Disp. Time Lamount Cohen Time) Disposition Final User 09/21/2023  1:54:58 PM Call EMS 911 Now Yes Margaretha Sheffield, RN, Cala Bradford 09/21/2023 2:00:30 PM 911 Outcome Documentation Margaretha Sheffield, RN, Cala Bradford Reason: Refusing to call Final Disposition 09/21/2023 1:54:58 PM Call EMS 911 Now Yes Margaretha Sheffield, RN, Anders Simmonds Disagree/Comply Comply Caller Understands Yes PreDisposition InappropriateToAsk Care Advice Given Per Guideline CALL EMS 911 NOW: * Immediate medical attention is needed. You need to hang up and call 911 (or an ambulance). CARE ADVICE given per Dizziness - Lightheadedness (Adult) guideline. Comments User: Doristine Counter, RN Date/Time Lamount Cohen Time): 09/21/2023 1:55:53 PM Numbness L hand x yesterday. [place]- has not seen a doctor during that time. User: Doristine Counter, RN Date/Time Lamount Cohen Time): 09/21/2023 2:02:30 PM Spoke to caller's daughter- explained risk of stroke- she explained to pt- Pt now agrees with dispo. Referrals GO TO FACILITY UNDECIDED

## 2023-09-21 NOTE — ED Notes (Signed)
Pt was able to walk to the bathroom for a urine sample

## 2023-09-21 NOTE — ED Notes (Signed)
Pt provided snack bag and orange juice per MD

## 2023-09-21 NOTE — Telephone Encounter (Signed)
FYI. Pt triaged to ED.  

## 2023-09-21 NOTE — ED Notes (Signed)
Patient transported to MRI 

## 2023-09-21 NOTE — ED Notes (Signed)
Pt currently in CT.

## 2023-09-21 NOTE — ED Provider Notes (Signed)
[place] EMERGENCY DEPARTMENT AT [facility] Provider Note   CSN: [id number] Arrival date & time: 09/21/23  1546     History {Add pertinent medical, surgical, social history, OB history to HPI:1} Chief Complaint  Patient presents with   Dizziness   Generalized Body Aches    Ashley [name] is a 77 y.o. female.  77 year old female with past medical history of of SVT, CVA, hypertension presented to the emergency department today with slurred speech and generalized weakness starting yesterday.  The patient states she is also been having some intermittent left hand numbness.  This started this morning.  States that she is also been feeling very dizzy since this morning.  She states that she feels like she is having difficulty ambulating straight.  She states that the numbness has improved as well as the dizziness now that she is here.  She has had some nausea but denies any vomiting.  Patient does report that she has been having some abdominal cramping as well today.  She states that she has had some nausea but denies any vomiting.  She denies any associated chest pain.  She states the dizziness is more of a disequilibrium rather than lightheadedness.  She came to the emergency department today for further evaluation regarding this.  She does have a history of meningioma as well.  History obtained using Spanish interpreter via iPad   Dizziness Associated symptoms: nausea        Home Medications Prior to Admission medications   Medication Sig Start Date End Date Taking? Authorizing Provider  acetaminophen (TYLENOL) 500 MG tablet Take 500 mg by mouth every 6 (six) hours as needed for moderate pain (pain score 4-6).   Yes [provider]  amLODipine (NORVASC) 2.5 MG tablet Take 2.5 mg by mouth daily.   Yes [provider]  aspirin EC 81 MG tablet Take 81 mg by mouth daily. Swallow whole.   Yes [provider]  atorvastatin (LIPITOR) 20 MG tablet  Take 20 mg by mouth daily. 07/15/22  Yes [provider]  CARVEDILOL PO Take 1 tablet by mouth daily.   Yes [provider]  chlorthalidone (HYGROTON) 25 MG tablet Take 25 mg by mouth daily.   Yes [provider]  omeprazole (PRILOSEC) 20 MG capsule Take 20 mg by mouth daily. 07/15/22  Yes [provider]  valsartan (DIOVAN) 80 MG tablet Take 80 mg by mouth daily.   Yes [provider]  magnesium oxide (MAG-OX) 400 MG tablet Take 1 tablet by mouth daily. Patient not taking: Reported on 09/21/2023 07/15/22   [provider]  zoledronic acid (RECLAST) 5 MG/100ML SOLN injection Inject 5 mg into the vein once.    [provider]      Allergies    Patient has no known allergies.    Review of Systems   Review of Systems  Gastrointestinal:  Positive for abdominal pain and nausea.  Neurological:  Positive for dizziness.  All other systems reviewed and are negative.   Physical Exam Updated Vital Signs BP (!) 170/53 (BP Location: Right Arm)   Pulse (!) 47   Temp 98.2 F (36.8 C) (Oral)   Resp 13   SpO2 97%  Physical Exam Vitals and nursing note reviewed.   Gen: NAD Eyes: PERRL, EOMI HEENT: no oropharyngeal swelling Neck: trachea midline Resp: clear to auscultation bilaterally Card: RRR, no murmurs, rubs, or gallops Abd: Periumbilical tenderness with no guarding or rebound Extremities: no calf tenderness, no edema  Vascular: 2+ radial pulses bilaterally, 2+ DP pulses bilaterally Neuro: NIH stroke scale of 0 Skin: no rashes Psyc: acting appropriately   ED Results / Procedures / Treatments   Labs (all labs ordered are listed, but only abnormal results are displayed) Labs Reviewed  RESP PANEL BY RT-PCR (RSV, FLU A&B, COVID)  RVPGX2  BASIC METABOLIC PANEL  CBC  URINALYSIS, ROUTINE W REFLEX MICROSCOPIC  CK  HEPATIC FUNCTION PANEL  LIPASE, BLOOD  TSH  CBG MONITORING, ED  TROPONIN I (HIGH SENSITIVITY)    EKG EKG  Interpretation Date/Time:  Monday September 21 2023 15:56:34 EST Ventricular Rate:  47 PR Interval:  178 QRS Duration:  96 QT Interval:  460 QTC Calculation: 407 R Axis:   -36  Text Interpretation: Sinus bradycardia Left axis deviation Incomplete right bundle branch block Moderate voltage criteria for LVH, may be normal variant ( R in aVL , Cornell product ) Abnormal ECG When compared with ECG of 21-Dec-2017 11:53, PREVIOUS ECG IS PRESENT Confirmed by Beckey Downing [phone]) on 09/21/2023 4:59:51 PM  Radiology No results found.  Procedures Procedures  {Document cardiac monitor, telemetry assessment procedure when appropriate:1}  Medications Ordered in ED Medications  meclizine (ANTIVERT) tablet 25 mg (has no administration in time range)  ondansetron (ZOFRAN) injection 4 mg (has no administration in time range)    ED Course/ Medical Decision Making/ A&P   {   Click here for ABCD2, HEART and other calculatorsREFRESH Note before signing :1}                              Medical Decision Making 77 year old female with past medical history of SVT, CVA, and hypertension presenting to the emergency department today with multiple complaints including dizziness, slurred speech, and left hand numbness.  The patient states this been going on since she woke up this morning.  She is outside the window for thrombolytics at this time.  She denies any significant headache.  She also complaining some abdominal cramping and nausea.  I will further evaluate the patient here with basic labs Wels and EKG, and troponin to evaluate for atypical ACS, electrolyte abnormalities, or anemia.  I will obtain an MRI of her brain to evaluate for CVA.  Also obtain a CT scan of her abdomen to evaluate for appendicitis, diverticulitis, colitis, or other intra-abdominal pathology given her symptoms.  I will give the patient meclizine in the event this is due to peripheral vertigo.  I will reevaluate for ultimate  disposition.  Amount and/or Complexity of Data Reviewed Labs: ordered. Radiology: ordered.  Risk Prescription drug management.   ***  {Document critical care time when appropriate:1} {Document review of labs and clinical decision tools ie heart score, Chads2Vasc2 etc:1}  {Document your independent review of radiology images, and any outside records:1} {Document your discussion with family members, caretakers, and with consultants:1} {Document social determinants of health affecting pt's care:1} {Document your decision making why or why not admission, treatments were needed:1} Final Clinical Impression(s) / ED Diagnoses Final diagnoses:  None    Rx / DC Orders ED Discharge Orders     None

## 2023-09-22 ENCOUNTER — Encounter (HOSPITAL_COMMUNITY): Payer: Self-pay

## 2023-09-22 ENCOUNTER — Other Ambulatory Visit: Payer: Self-pay

## 2023-09-22 MED ORDER — AMLODIPINE BESYLATE 5 MG PO TABS: 5.0000 mg | ORAL_TABLET | Freq: Every day | ORAL | 0 refills | Status: DC

## 2023-09-22 MED ORDER — ESOMEPRAZOLE MAGNESIUM 20 MG PO CPDR: 20.0000 mg | DELAYED_RELEASE_CAPSULE | Freq: Every day | ORAL | 0 refills | Status: DC

## 2023-09-22 MED ORDER — CARVEDILOL 3.125 MG PO TABS: 3.1250 mg | ORAL_TABLET | Freq: Every day | ORAL | 0 refills | Status: DC

## 2023-09-22 MED ORDER — ATORVASTATIN CALCIUM 20 MG PO TABS: 20.0000 mg | ORAL_TABLET | Freq: Every day | ORAL | 0 refills | Status: DC

## 2023-09-22 MED ORDER — VALSARTAN 80 MG PO TABS: 80.0000 mg | ORAL_TABLET | Freq: Every day | ORAL | 0 refills | Status: DC

## 2023-09-22 NOTE — Discharge Instructions (Addendum)
Your workup today was reassuring.  Please stop taking your carvedilol from your blister pack.  I have sent a lower dose of this to the pharmacy.  Your heart rate is low and I think that this may be the cause for that.  This may be causing some of your dizziness as well.  I sent a different medication to help bring your blood pressure down that should not affect your heart rate.  I did put in a consult to our cardiologist.  Please follow-up with them.  Return to the ER for worsening symptoms.

## 2023-09-23 ENCOUNTER — Ambulatory Visit: Payer: 59 | Admitting: Internal Medicine

## 2023-09-25 ENCOUNTER — Ambulatory Visit (INDEPENDENT_AMBULATORY_CARE_PROVIDER_SITE_OTHER): Payer: 59 | Admitting: Internal Medicine

## 2023-09-25 ENCOUNTER — Encounter: Payer: Self-pay | Admitting: Internal Medicine

## 2023-09-25 VITALS — BP 126/76 | HR 58 | Temp 97.8°F | Resp 16 | Ht 59.0 in | Wt 153.0 lb

## 2023-09-25 DIAGNOSIS — R42 Dizziness and giddiness: Secondary | ICD-10-CM | POA: Diagnosis not present

## 2023-09-25 DIAGNOSIS — I10 Essential (primary) hypertension: Secondary | ICD-10-CM | POA: Diagnosis not present

## 2023-09-25 DIAGNOSIS — N83202 Unspecified ovarian cyst, left side: Secondary | ICD-10-CM

## 2023-09-25 NOTE — Patient Instructions (Addendum)
Get a new BP cuff     Check the  blood pressure regularly Blood pressure goal:  between 110/65 and  135/85. If it is consistently higher or lower, let me know   Next visit with me in 2 months Please schedule it at the front desk

## 2023-09-25 NOTE — Progress Notes (Unsigned)
Subjective:    Patient ID: Ashley [name], female    DOB: 1946/07/23, 77 y.o.   MRN: [id number]  DOS:  09/25/2023 Type of visit - description: ER follow-up, here with her daughter Ashley [name].  ER 09/21/2023. 1 day prior to the emergency room visit, she got dizzy, had a headache and nausea. Symptoms started kind of statin. No associated stroke symptoms such as slurred speech, motor deficits, asymmetric face. Describe the dizziness as triggered by moving her head or standing up.  ER workup: Respiratory panel negative. BMP okay.  LFTs normal.  CBC okay except for slightly low platelet count.  TSH normal. MRI brain no acute.  CT abdomen and pelvis with contrast no acute. Did show a 3.5 cm L ovarian cyst, follow-up in 6 to 12 months. UA negative  At this point, she is better, still has mild residual headache.  BP Readings from Last 3 Encounters:  09/25/23 126/74  09/22/23 (!) 129/50  08/15/22 122/68    Ashley [name] is a 77 y.o. 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 Patient presented to the ED on 12/21/17 with complaints of acute onset left eye blindness for 10 minutes and generalized weakness which occurred the day prior. She was diagnosed with a mini-stroke. She underwent a carotid US which demonstrated a solid mass adjacent to the right carotid body measuring 3.7 cm. 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 She has a h/o meningioma of the brain which has been followed with MRI scans and is reportedly stable.  Update 07/13/18: Patient presents today for follow up. She states being dizzy, having headaches, SOB and twitching/jumping of her right eye, right face, and right neck. She also reports periodic mouth numbness. She also reports general fatigue and difficulty walking due to her feet "feeling heavy". She states she has been having a  lot of depression lately and lately has been wanting to be in isolation; associated nightmares at night that leaves her anxious and SOB. Her sleep is being disturbed by a combination of these nightmares and getting up to use the bathroom. She does not associate any anxiety with the mass in her neck at all. She is present today with her daughter, granddaughter, and the translator today.   Update 09/28/18: Patient is s/p excision of right carotid body tumor 09/14/18. She is doing well overall. She does report severe intermittent left arm pain and loss of strength as the surgery. The pain radiates up her arm and along her shoulder and upper pectoral. She also reports all foods tasting sour.    .  Review of Systems See above   Past Medical History:  Diagnosis Date   Amaurosis fugax    Carotid body tumor (HCC)    GERD (gastroesophageal reflux disease)    Hyperlipidemia    Hypertension    Meningioma (HCC)    Mood disorder (HCC)    Osteoporosis    Paraganglioma (HCC)    SVT (supraventricular tachycardia) (HCC)    TIA (transient ischemic attack)     Past Surgical History:  Procedure Laterality Date   CHOLECYSTECTOMY     HERNIA REPAIR     IR TRANSCATH/EMBOLIZ     OTHER SURGICAL HISTORY  09/14/2018   carotid lesion    Current Outpatient Medications  Medication Instructions   amLODipine (NORVASC) 5 mg, Oral, Daily   aspirin EC 81 mg,  Oral, Daily, Swallow whole.   atorvastatin (LIPITOR) 20 mg, Oral, Daily   carvedilol (COREG) 3.125 mg, Oral, Daily   chlorthalidone (HYGROTON) 25 mg, Oral, Daily   Cholecalciferol 2,000 Units, Oral, Daily   diclofenac Sodium (VOLTAREN) 4 g, Topical, 4 times daily, Use for both knees   esomeprazole (NEXIUM) 20 mg, Oral, Daily at bedtime   magnesium oxide (MAG-OX) 400 MG tablet 1 tablet, Oral, Daily   valsartan (DIOVAN) 80 mg, Oral, Daily   zoledronic acid (RECLAST) 5 mg,  Once       Objective:   Physical Exam BP 126/74   Pulse (!) 58   Temp 97.8  F (36.6 C) (Oral)   Resp 16   Ht 4\' 11"  (1.499 m)   Wt 153 lb (69.4 kg)   SpO2 96%   BMI 30.90 kg/m  General:   Well developed, NAD, BMI noted. HEENT:  Normocephalic . Face symmetric, atraumatic Neck: Normal carotid pulses Lungs:  CTA B Normal respiratory effort, no intercostal retractions, no accessory muscle use. Heart: RRR,  no murmur.  Lower extremities: no pretibial edema bilaterally  Skin: Not pale. Not jaundice Neurologic:  alert & oriented X3.  Speech normal, gait appropriate for age and unassisted. Face symmetric, motor symmetric, EOMI. Psych--  Cognition and judgment appear intact.  Cooperative with normal attention span and concentration.  Behavior appropriate. No anxious or depressed appearing.      Assessment     ASSESSMENT  (new patient, 07-2022, refer by his son-in-law) HTN High cholesterol Depression Osteoporosis Vitamin D deficiency GERD, abnormal barium study, EGD recommended 10-2022 Carotid body tumor s/p excision H/o meningioma anterior cranial fossa , declined surgery, elected observation H/o palpitations FH CAD, Breast and colon cancer   PLAN Dizziness, as described above.  Sounds like peripheral dizziness.  Had an acute episode earlier this week, workup at the ER negative, she feels better. Since this is a recurrent issue, we will ask neurology to evaluate. HTN: All ambulatory BP readings are elevated in the 160s, 180s, all office base  readings are normal.  At the recent ER visit BP was 140/94. BP at this office checked again: 126/72.  BP with her own wrist cuff 177/72. Recommend no change for now.  Get a new BP cuff.  L left ovarian cyst per CT 05/21/2023, recommend follow-up in 6 to 12 months.  This was discussed with the patient. RTC 2 months    New patient Labs  from 07/15/2022: 07/15/2022: Potassium 3.3, creatinine 0.5, LFTs normal.  Sed rate normal. CBC normal.  HTN: Reports a long history of hypertension, on multiple medications  including amlodipine, chlorthalidone, Lasix, magnesium, metoprolol and Aldactone. She has been seen by nephrology and cardiology in the past. BP today is good, in the ambulatory setting reports that from time to time BP go as high as 180. Plan: Recheck BMP, RF meds as needed. High cholesterol: On atorvastatin, check FLP. Depression: Lost a brother to a MI last month, her sister is ill, her mother is 41 years old and not doing well.  Additionally few weeks ago she came back from visiting [place] and unexpectedly her husband's family moved him to [place]  w/o notifying her reason why is now living in [place] with her daughter and son-in-law. Not interested in antidepressants.  Denies suicidal ideas. Insomnia: Rec when melatonin. GERD: Sees GI regularly, had a barium study February 2023, some evidence of dysmotility, has EGD scheduled for December. Osteoporosis: Per chart review, last Reclast injection on 03/17/2022 Vitamin  D deficiency: On OTCs, checking levels Preventive care: Flu shot today, COVID-vaccine recommended. Recommend to keep her current to specialists for now. RTC 2 to 3 months.

## 2023-09-26 NOTE — Assessment & Plan Note (Signed)
Dizziness, as described above.  Sounds like peripheral dizziness.  Had an acute episode earlier this week, workup at the ER negative, she feels better. In reviewing the chart, she has dizziness on and off, Meniere's?  Others?  Refer  to neurology.   HTN: All ambulatory BP readings are elevated in the 160s, 180s, all office base  readings are normal.  At the recent ER visit BP was 140/94. BP at this office checked again: 126/72.  BP with her own wrist cuff 177/72. Plan: Continue Diovan, carvedilol, amlodipine, chlorthalidone. Get a new BP cuff . L left ovarian cyst per CT 05/21/2023, recommend follow-up in 6 to 12 months.  This was discussed with the patient. RTC 2 months

## 2023-10-05 LAB — HEMOGLOBIN A1C: Hemoglobin A1C: 5.2

## 2023-10-08 ENCOUNTER — Encounter (HOSPITAL_BASED_OUTPATIENT_CLINIC_OR_DEPARTMENT_OTHER): Payer: Self-pay | Admitting: Family Medicine

## 2023-10-08 ENCOUNTER — Ambulatory Visit (INDEPENDENT_AMBULATORY_CARE_PROVIDER_SITE_OTHER): Payer: 59 | Admitting: Family Medicine

## 2023-10-08 ENCOUNTER — Encounter (HOSPITAL_BASED_OUTPATIENT_CLINIC_OR_DEPARTMENT_OTHER): Payer: Self-pay | Admitting: *Deleted

## 2023-10-08 VITALS — BP 134/72 | HR 67 | Ht 60.0 in | Wt 153.9 lb

## 2023-10-08 DIAGNOSIS — I10 Essential (primary) hypertension: Secondary | ICD-10-CM | POA: Diagnosis not present

## 2023-10-08 DIAGNOSIS — Z23 Encounter for immunization: Secondary | ICD-10-CM | POA: Diagnosis not present

## 2023-10-08 DIAGNOSIS — M810 Age-related osteoporosis without current pathological fracture: Secondary | ICD-10-CM

## 2023-10-08 DIAGNOSIS — R42 Dizziness and giddiness: Secondary | ICD-10-CM

## 2023-10-08 MED ORDER — AMLODIPINE BESYLATE 5 MG PO TABS: 5.0000 mg | ORAL_TABLET | Freq: Every day | ORAL | 1 refills | Status: DC

## 2023-10-08 MED ORDER — CHLORTHALIDONE 25 MG PO TABS: 25.0000 mg | ORAL_TABLET | Freq: Every day | ORAL | 1 refills | Status: DC

## 2023-10-08 MED ORDER — CARVEDILOL 3.125 MG PO TABS: 3.1250 mg | ORAL_TABLET | Freq: Two times a day (BID) | ORAL | 1 refills | Status: DC

## 2023-10-08 MED ORDER — VALSARTAN 80 MG PO TABS: 80.0000 mg | ORAL_TABLET | Freq: Every day | ORAL | 1 refills | Status: DC

## 2023-10-08 MED ORDER — ESOMEPRAZOLE MAGNESIUM 20 MG PO CPDR: 20.0000 mg | DELAYED_RELEASE_CAPSULE | Freq: Every day | ORAL | 1 refills | Status: DC

## 2023-10-08 MED ORDER — ATORVASTATIN CALCIUM 20 MG PO TABS: 20.0000 mg | ORAL_TABLET | Freq: Every day | ORAL | 1 refills | Status: DC

## 2023-10-08 NOTE — Assessment & Plan Note (Signed)
Blood pressure is appropriate in office today.  Recommend continue with current medication regimen, no change at this time.  May need to consider alternative blood pressure devices given that it does appear that these are not accurate.  Can also discuss further with cardiology and whether they feel that ambulatory monitoring device would be warranted in order to assess for any out of office elevations

## 2023-10-08 NOTE — Assessment & Plan Note (Signed)
Can place referral to endocrinology today for further evaluation and review of prior records and treatment to determine recommended steps moving forward in regards to any additional treatment or repeat bone density testing

## 2023-10-08 NOTE — Assessment & Plan Note (Signed)
Recommend continuing with planned evaluation with appointments with both cardiology and neurology upcoming.

## 2023-10-08 NOTE — Progress Notes (Signed)
New Patient Office Visit  Subjective    Patient ID: Ashley [name], female    DOB: 12-Feb-1946  Age: 77 y.o. MRN: [id number]  CC:  Chief Complaint  Patient presents with   New Patient (Initial Visit)    New patient insurance did home visit and told her she needed cardiology and neurology     HPI [facility] presents to establish care Last PCP - did recently establish with Dr. Drue Novel locally, but needed provider closer to where she is living now  HTN: Current medications include amlodipine, carvedilol, chlorthalidone, valsartan.  Denies any current issues related to medications.  She does have appointment scheduled to establish with cardiologist upcoming.  No current issues related to chest pain or headaches.  She does check blood pressure at home and has noted elevated readings at home.  She has had prior comparison of home blood pressure cuff with an office cuff at prior PCP and it was noted that her home blood pressure cuff was consistently resulting in higher blood pressure reading.  Uncertain accuracy of home blood pressure device  Dizziness: Patient had recent ED evaluation related to this.  Had labs and imaging completed at that time including MRI brain and CT abdomen/pelvis.  Findings from these were unremarkable regards to assessment of underlying dizziness.  Did have incidental ovarian cyst noted as below.  She continues to have intermittent symptoms related to dizziness.  Does have appointment scheduled with cardiology and neurology upcoming.  Osteoporosis: Indicates that she was previously receiving treatment through [facility] provider.  She was scheduled to have follow-up, however she missed [place].  She is not certain of when she was need to have next treatment administered.  Was previously receiving Reclast.  Ovarian cyst: During recent ER evaluation for dizziness, she did have CT abdomen pelvis completed with incidental finding of left ovarian cyst  observed.  She is not currently having any abdominal symptoms which are felt to be related to this.  Recommendation from radiologist was for follow-up with ultrasound in about 6 to 12 months.  Patient is originally from [place], has been living here for about 25 years. She enjoys sewing, painting, going to the pool.  Outpatient Encounter Medications as of 10/08/2023  Medication Sig   aspirin EC 81 MG tablet Take 81 mg by mouth daily. Swallow whole.   Cholecalciferol 50 MCG (2000 UT) TABS Take 2,000 Units by mouth daily.   diclofenac Sodium (VOLTAREN) 1 % GEL Apply 4 g topically 4 (four) times daily. Use for both knees   magnesium oxide (MAG-OX) 400 MG tablet Take 1 tablet by mouth daily.   zoledronic acid (RECLAST) 5 MG/100ML SOLN injection Inject 5 mg into the vein once.   [DISCONTINUED] amLODipine (NORVASC) 5 MG tablet Take 1 tablet (5 mg total) by mouth daily.   [DISCONTINUED] atorvastatin (LIPITOR) 20 MG tablet Take 1 tablet (20 mg total) by mouth daily.   [DISCONTINUED] carvedilol (COREG) 3.125 MG tablet Take 1 tablet (3.125 mg total) by mouth daily.   [DISCONTINUED] esomeprazole (NEXIUM) 20 MG capsule Take 1 capsule (20 mg total) by mouth at bedtime.   [DISCONTINUED] valsartan (DIOVAN) 80 MG tablet Take 1 tablet (80 mg total) by mouth daily.   amLODipine (NORVASC) 5 MG tablet Take 1 tablet (5 mg total) by mouth daily.   atorvastatin (LIPITOR) 20 MG tablet Take 1 tablet (20 mg total) by mouth daily.   carvedilol (COREG) 3.125 MG tablet Take 1 tablet (3.125 mg  total) by mouth 2 (two) times daily with a meal.   chlorthalidone (HYGROTON) 25 MG tablet Take 1 tablet (25 mg total) by mouth daily.   esomeprazole (NEXIUM) 20 MG capsule Take 1 capsule (20 mg total) by mouth at bedtime.   valsartan (DIOVAN) 80 MG tablet Take 1 tablet (80 mg total) by mouth daily.   [DISCONTINUED] chlorthalidone (HYGROTON) 25 MG tablet Take 25 mg by mouth daily. (Patient not taking: Reported on 10/08/2023)   No  facility-administered encounter medications on file as of 10/08/2023.    Past Medical History:  Diagnosis Date   Amaurosis fugax    Carotid body tumor (HCC)    GERD (gastroesophageal reflux disease)    Hyperlipidemia    Hypertension    Meningioma (HCC)    Mood disorder (HCC)    Osteoporosis    Paraganglioma (HCC)    SVT (supraventricular tachycardia) (HCC)    TIA (transient ischemic attack)     Past Surgical History:  Procedure Laterality Date   CHOLECYSTECTOMY     HERNIA REPAIR     IR TRANSCATH/EMBOLIZ     OTHER SURGICAL HISTORY  09/14/2018   carotid lesion    Family History  Problem Relation Age of Onset   CAD Father 57   Colon cancer Sister    Breast cancer Sister    Head & neck cancer Brother    CAD Brother 25       MI    Social History   Socioeconomic History   Marital status: Married    Spouse name: Not on file   Number of children: 5   Years of education: Not on file   Highest education level: Not on file  Occupational History   Not on file  Tobacco Use   Smoking status: Never    Passive exposure: Never   Smokeless tobacco: Never  Vaping Use   Vaping status: Never Used  Substance and Sexual Activity   Alcohol use: No   Drug use: No   Sexual activity: Not on file  Other Topics Concern   Not on file  Social History Narrative   Original from [place]   Moved to [place] 1999, moved  from [place] to [place] 06-2022 to live w/ daughter and son in law [name])    Married, don't have children in common , husband living in [place] as of 07-2022    2 son, 2 daughters   Lost a son   Social Determinants of Corporate investment banker Strain: Not on file  Food Insecurity: Not on file  Transportation Needs: No Transportation Needs (01/27/2022)   Received from [facility], [facility]   [facility] - Transportation    Lack of Transportation (Medical): No    Lack of Transportation (Non-Medical): No  Physical Activity: Not on file  Stress: Not on file  Social  Connections: Not on file  Intimate Partner Violence: Not on file    Objective    BP 134/72 (BP Location: Left Arm, Patient Position: Sitting, Cuff Size: Normal)   Pulse 67   Ht 5' (1.524 m)   Wt 153 lb 14.4 oz (69.8 kg)   SpO2 97%   BMI 30.06 kg/m   Physical Exam  77 year old female in no acute distress Cardiovascular exam with regular rate and rhythm, no murmur appreciated Lungs clear to auscultation bilaterally  Assessment & Plan:   Hypertension, benign Assessment & Plan: Blood pressure is appropriate in office today.  Recommend continue with current medication regimen,  no change at this time.  May need to consider alternative blood pressure devices given that it does appear that these are not accurate.  Can also discuss further with cardiology and whether they feel that ambulatory monitoring device would be warranted in order to assess for any out of office elevations   Osteoporosis without current pathological fracture, unspecified osteoporosis type Assessment & Plan: Can place referral to endocrinology today for further evaluation and review of prior records and treatment to determine recommended steps moving forward in regards to any additional treatment or repeat bone density testing  Orders: -     Ambulatory referral to Endocrinology  Dizziness Assessment & Plan: Recommend continuing with planned evaluation with appointments with both cardiology and neurology upcoming.   Other orders -     amLODIPine Besylate; Take 1 tablet (5 mg total) by mouth daily.  Dispense: 90 tablet; Refill: 1 -     Atorvastatin Calcium; Take 1 tablet (20 mg total) by mouth daily.  Dispense: 90 tablet; Refill: 1 -     Carvedilol; Take 1 tablet (3.125 mg total) by mouth 2 (two) times daily with a meal.  Dispense: 180 tablet; Refill: 1 -     Chlorthalidone; Take 1 tablet (25 mg total) by mouth daily.  Dispense: 90 tablet; Refill: 1 -     Valsartan; Take 1 tablet (80 mg total) by mouth daily.   Dispense: 90 tablet; Refill: 1 -     Esomeprazole Magnesium; Take 1 capsule (20 mg total) by mouth at bedtime.  Dispense: 90 capsule; Refill: 1  Return in about 3 months (around 01/08/2024) for hypertension, med check.    ___________________________________________ Shann Merrick de [place], MD, ABFM, CAQSM Primary Care and Sports Medicine [facility]

## 2023-10-13 ENCOUNTER — Encounter (HOSPITAL_BASED_OUTPATIENT_CLINIC_OR_DEPARTMENT_OTHER): Payer: Self-pay

## 2023-10-13 ENCOUNTER — Ambulatory Visit (INDEPENDENT_AMBULATORY_CARE_PROVIDER_SITE_OTHER): Payer: 59 | Admitting: *Deleted

## 2023-10-13 DIAGNOSIS — Z0000 Encounter for general adult medical examination without abnormal findings: Secondary | ICD-10-CM | POA: Diagnosis not present

## 2023-10-13 NOTE — Progress Notes (Signed)
Subjective:   Ashley [name] is a 77 y.o. female who presents for Medicare Annual (Subsequent) preventive examination.  Visit Complete: Virtual I connected with  Ashley [name]   with a spanish interpretor on 10/13/23 by a audio enabled telemedicine application and verified that I am speaking with the correct person using two identifiers.  Patient Location: Home  Provider Location: Home Office  I discussed the limitations of evaluation and management by telemedicine. The patient expressed understanding and agreed to proceed.  Vital Signs: Because this visit was a virtual/telehealth visit, some criteria may be missing or patient reported. Any vitals not documented were not able to be obtained and vitals that have been documented are patient reported.    Cardiac Risk Factors include: advanced age (>61men, >5 women);hypertension;obesity (BMI >30kg/m2)     Objective:    There were no vitals filed for this visit. There is no height or weight on file to calculate BMI.     10/13/2023    1:46 PM 09/21/2023    3:55 PM 12/21/2017   11:42 AM  Advanced Directives  Does Patient Have a Medical Advance Directive? No No No  Would patient like information on creating a medical advance directive? No - Patient declined No - Patient declined     Current Medications (verified) Outpatient Encounter Medications as of 10/13/2023  Medication Sig   amLODipine (NORVASC) 5 MG tablet Take 1 tablet (5 mg total) by mouth daily.   aspirin EC 81 MG tablet Take 81 mg by mouth daily. Swallow whole.   atorvastatin (LIPITOR) 20 MG tablet Take 1 tablet (20 mg total) by mouth daily.   carvedilol (COREG) 3.125 MG tablet Take 1 tablet (3.125 mg total) by mouth 2 (two) times daily with a meal.   chlorthalidone (HYGROTON) 25 MG tablet Take 1 tablet (25 mg total) by mouth daily.   Cholecalciferol 50 MCG (2000 UT) TABS Take 2,000 Units by mouth daily.   diclofenac Sodium (VOLTAREN) 1 % GEL Apply 4 g topically 4  (four) times daily. Use for both knees   esomeprazole (NEXIUM) 20 MG capsule Take 1 capsule (20 mg total) by mouth at bedtime.   magnesium oxide (MAG-OX) 400 MG tablet Take 1 tablet by mouth daily.   valsartan (DIOVAN) 80 MG tablet Take 1 tablet (80 mg total) by mouth daily.   zoledronic acid (RECLAST) 5 MG/100ML SOLN injection Inject 5 mg into the vein once.   No facility-administered encounter medications on file as of 10/13/2023.    Allergies (verified) Patient has no known allergies.   History: Past Medical History:  Diagnosis Date   Amaurosis fugax    Carotid body tumor (HCC)    GERD (gastroesophageal reflux disease)    Hyperlipidemia    Hypertension    Meningioma (HCC)    Mood disorder (HCC)    Osteoporosis    Paraganglioma (HCC)    SVT (supraventricular tachycardia) (HCC)    TIA (transient ischemic attack)    Past Surgical History:  Procedure Laterality Date   CHOLECYSTECTOMY     HERNIA REPAIR     IR TRANSCATH/EMBOLIZ     OTHER SURGICAL HISTORY  09/14/2018   carotid lesion   Family History  Problem Relation Age of Onset   CAD Father 82   Colon cancer Sister    Breast cancer Sister    Head & neck cancer Brother    CAD Brother 71       MI   Social History   Socioeconomic History  Marital status: Married    Spouse name: Not on file   Number of children: 5   Years of education: Not on file   Highest education level: Not on file  Occupational History   Not on file  Tobacco Use   Smoking status: Never    Passive exposure: Never   Smokeless tobacco: Never  Vaping Use   Vaping status: Never Used  Substance and Sexual Activity   Alcohol use: No   Drug use: No   Sexual activity: Not on file  Other Topics Concern   Not on file  Social History Narrative   Original from [place]   Moved to [place] 1999, moved  from [employer] to [place] 06-2022 to live w/ daughter and son in law Ashley [name])    Married, don't have children in common , husband living in [place] as of 07-2022     2 son, 2 daughters   Lost a son   Social Determinants of Health   Financial Resource Strain: Low Risk  (10/13/2023)   Overall Financial Resource Strain (CARDIA)    Difficulty of Paying Living Expenses: Not very hard  Food Insecurity: Food Insecurity Present (10/13/2023)   Hunger Vital Sign    Worried About Running Out of Food in the Last Year: Sometimes true    Ran Out of Food in the Last Year: Sometimes true  Transportation Needs: No Transportation Needs (10/13/2023)   PRAPARE - Administrator, Civil Service (Medical): No    Lack of Transportation (Non-Medical): No  Physical Activity: Insufficiently Active (10/13/2023)   Exercise Vital Sign    Days of Exercise per Week: 3 days    Minutes of Exercise per Session: 30 min  Stress: No Stress Concern Present (10/13/2023)   [employer] of Occupational Health - Occupational Stress Questionnaire    Feeling of Stress : Not at all  Social Connections: Moderately Integrated (10/13/2023)   Social Connection and Isolation Panel [NHANES]    Frequency of Communication with Friends and Family: More than three times a week    Frequency of Social Gatherings with Friends and Family: Once a week    Attends Religious Services: More than 4 times per year    Active Member of [employer] or Organizations: No    Attends Engineer, structural: Never    Marital Status: Married    Tobacco Counseling Counseling given: Not Answered   Clinical Intake:  Pre-visit preparation completed: Yes  Pain : No/denies pain     Diabetes: No  How often do you need to have someone help you when you read instructions, pamphlets, or other written materials from your doctor or pharmacy?: 3 - Sometimes  Interpreter Needed?: No  Information entered by :: Ashley Haggard LPN   Activities of Daily Living    10/13/2023    1:56 PM  In your present state of health, do you have any difficulty performing the following activities:  Hearing? 0   Vision? 0  Difficulty concentrating or making decisions? 0  Walking or climbing stairs? 1  Doing errands, shopping? 1  Preparing Food and eating ? N  Using the Toilet? N  In the past six months, have you accidently leaked urine? Y  Do you have problems with loss of bowel control? N  Managing your Medications? N  Managing your Finances? N  Housekeeping or managing your Housekeeping? Y    Patient Care Team: [place], Buren Kos, MD as PCP - General (Family Medicine)  Indicate any  recent Medical Services you may have received from other than [facility] providers in the past year (date may be approximate).     Assessment:   This is a routine wellness examination for [facility].  Hearing/Vision screen Hearing Screening - Comments:: No trouble hearin Vision Screening - Comments:: Not up date    Goals Addressed             This Visit's Progress    Patient Stated       Continue current lifestyle       Depression Screen    10/13/2023    1:53 PM 10/08/2023    1:39 PM 09/25/2023    1:08 PM 08/15/2022    1:56 PM  PHQ 2/9 Scores  PHQ - 2 Score 2 1 0 3  PHQ- 9 Score 4 5 0 8    Fall Risk    10/13/2023    1:40 PM 10/08/2023    1:38 PM 09/25/2023    1:08 PM 08/15/2022    1:22 PM  Fall Risk   Falls in the past year? 0 0 0 0  Number falls in past yr: 0 0 0 0  Injury with Fall? 0 0 0 0  Risk for fall due to :  No Fall Risks    Follow up Falls evaluation completed;Education provided;Falls prevention discussed Falls evaluation completed Falls evaluation completed Falls evaluation completed    MEDICARE RISK AT HOME: Medicare Risk at Home Any stairs in or around the home?: Yes If so, are there any without handrails?: No Home free of loose throw rugs in walkways, pet beds, electrical cords, etc?: Yes Adequate lighting in your home to reduce risk of falls?: Yes Life alert?: No Use of a cane, walker or w/c?: Yes Grab bars in the bathroom?: No Shower chair or bench in shower?:  No Elevated toilet seat or a handicapped toilet?: No  TIMED UP AND GO:  Was the test performed?  No    Cognitive Function:        10/13/2023    1:42 PM  6CIT Screen  What Year? 0 points  What month? 0 points  What time? 0 points  Count back from 20 2 points  Months in reverse 2 points  Repeat phrase 2 points  Total Score 6 points    Immunizations Immunization History  Administered Date(s) Administered   Fluad Quad(high Dose 65+) 08/15/2022   Fluad Trivalent(High Dose 65+) 10/08/2023   Influenza-Unspecified 09/27/2021   PFIZER(Purple Top)SARS-COV-2 Vaccination 12/16/2019, 01/11/2020, 11/15/2020   Pneumococcal Conjugate-13 03/08/2015   Pneumococcal Polysaccharide-23 12/03/2011    TDAP status: Due, Education has been provided regarding the importance of this vaccine. Advised may receive this vaccine at local pharmacy or Health Dept. Aware to provide a copy of the vaccination record if obtained from local pharmacy or Health Dept. Verbalized acceptance and understanding.  Flu Vaccine status: Up to date  Pneumococcal vaccine status: Up to date  Covid-19 vaccine status: Information provided on how to obtain vaccines.   Qualifies for Shingles Vaccine? Yes   Zostavax completed No   Shingrix Completed?: No.    Education has been provided regarding the importance of this vaccine. Patient has been advised to call insurance company to determine out of pocket expense if they have not yet received this vaccine. Advised may also receive vaccine at local pharmacy or Health Dept. Verbalized acceptance and understanding.  Screening Tests Health Maintenance  Topic Date Due   COVID-19 Vaccine (4 - 2023-24 season) 10/29/2023 (Originally 07/19/2023)  Zoster Vaccines- Shingrix (1 of 2) 01/13/2024 (Originally 06/11/1994)   DTaP/Tdap/Td (1 - Tdap) 10/12/2024 (Originally 06/12/1965)   Hepatitis C Screening  10/12/2024 (Originally 06/11/1964)   Medicare Annual Wellness (AWV)  10/12/2024    Pneumonia Vaccine 73+ Years old  Completed   INFLUENZA VACCINE  Completed   DEXA SCAN  Completed   HPV VACCINES  Aged Out    Health Maintenance  There are no preventive care reminders to display for this patient.   Colorectal cancer screening: No longer required.   Mammogram   Education provided  Bone Density status: Completed  . Results reflect: Bone density results: OSTEOPOROSIS. Repeat every 2 years.  Lung Cancer Screening: (Low Dose CT Chest recommended if Age 15-80 years, 20 pack-year currently smoking OR have quit w/in 15years.) does not qualify.   Lung Cancer Screening Referral:   Additional Screening:  Hepatitis C Screening  never  done  Vision Screening: Recommended annual ophthalmology exams for early detection of glaucoma and other disorders of the eye. Is the patient up to date with their annual eye exam?  No  Who is the provider or what is the name of the office in which the patient attends annual eye exams?  If pt is not established with a provider, would they like to be referred to a provider to establish care? No .   Dental Screening: Recommended annual dental exams for proper oral hygiene    Community Resource Referral / Chronic Care Management: CRR required this visit?  No   CCM required this visit?  No     Plan:     I have personally reviewed and noted the following in the patient's chart:   Medical and social history Use of alcohol, tobacco or illicit drugs  Current medications and supplements including opioid prescriptions. Patient is not currently taking opioid prescriptions. Functional ability and status Nutritional status Physical activity Advanced directives List of other physicians Hospitalizations, surgeries, and ER visits in previous 12 months Vitals Screenings to include cognitive, depression, and falls Referrals and appointments  In addition, I have reviewed and discussed with patient certain preventive protocols, quality  metrics, and best practice recommendations. A written personalized care plan for preventive services as well as general preventive health recommendations were provided to patient.     Ashley Haggard, LPN   [id number]   After Visit Summary: (MyChart) Due to this being a telephonic visit, the after visit summary with patients personalized plan was offered to patient via MyChart   Nurse Notes:

## 2023-10-13 NOTE — Patient Instructions (Signed)
Ms. Ashley [name] , Thank you for taking time to come for your Medicare Wellness Visit. I appreciate your ongoing commitment to your health goals. Please review the following plan we discussed and let me know if I can assist you in the future.   Screening recommendations/referrals: Colonoscopy: no longer required Mammogram:  Bone Density: Education provided Recommended yearly ophthalmology/optometry visit for glaucoma screening and checkup Recommended yearly dental visit for hygiene and checkup  Vaccinations: Influenza vaccine:  Pneumococcal vaccine:  Tdap vaccine:   Education provided     Preventive Care 65 Years and Older, Female Preventive care refers to lifestyle choices and visits with your health care provider that can promote health and wellness. What does preventive care include? A yearly physical exam. This is also called an annual well check. Dental exams once or twice a year. Routine eye exams. Ask your health care provider how often you should have your eyes checked. Personal lifestyle choices, including: Daily care of your teeth and gums. Regular physical activity. Eating a healthy diet. Avoiding tobacco and drug use. Limiting alcohol use. Practicing safe sex. Taking low-dose aspirin every day. Taking vitamin and mineral supplements as recommended by your health care provider. What happens during an annual well check? The services and screenings done by your health care provider during your annual well check will depend on your age, overall health, lifestyle risk factors, and family history of disease. Counseling  Your health care provider may ask you questions about your: Alcohol use. Tobacco use. Drug use. Emotional well-being. Home and relationship well-being. Sexual activity. Eating habits. History of falls. Memory and ability to understand (cognition). Work and work Astronomer. Reproductive health. Screening  You may have the following tests or  measurements: Height, weight, and BMI. Blood pressure. Lipid and cholesterol levels. These may be checked every 5 years, or more frequently if you are over 38 years old. Skin check. Lung cancer screening. You may have this screening every year starting at age 45 if you have a 30-pack-year history of smoking and currently smoke or have quit within the past 15 years. Fecal occult blood test (FOBT) of the stool. You may have this test every year starting at age 78. Flexible sigmoidoscopy or colonoscopy. You may have a sigmoidoscopy every 5 years or a colonoscopy every 10 years starting at age 42. Hepatitis C blood test. Hepatitis B blood test. Sexually transmitted disease (STD) testing. Diabetes screening. This is done by checking your blood sugar (glucose) after you have not eaten for a while (fasting). You may have this done every 1-3 years. Bone density scan. This is done to screen for osteoporosis. You may have this done starting at age 15. Mammogram. This may be done every 1-2 years. Talk to your health care provider about how often you should have regular mammograms. Talk with your health care provider about your test results, treatment options, and if necessary, the need for more tests. Vaccines  Your health care provider may recommend certain vaccines, such as: Influenza vaccine. This is recommended every year. Tetanus, diphtheria, and acellular pertussis (Tdap, Td) vaccine. You may need a Td booster every 10 years. Zoster vaccine. You may need this after age 16. Pneumococcal 13-valent conjugate (PCV13) vaccine. One dose is recommended after age 57. Pneumococcal polysaccharide (PPSV23) vaccine. One dose is recommended after age 74. Talk to your health care provider about which screenings and vaccines you need and how often you need them. This information is not intended to replace advice given to you by your  health care provider. Make sure you discuss any questions you have with your  health care provider. Document Released: 11/30/2015 Document Revised: 07/23/2016 Document Reviewed: 09/04/2015 Elsevier Interactive Patient Education  2017 [employer].  Fall Prevention in the Home Falls can cause injuries. They can happen to people of all ages. There are many things you can do to make your home safe and to help prevent falls. What can I do on the outside of my home? Regularly fix the edges of walkways and driveways and fix any cracks. Remove anything that might make you trip as you walk through a door, such as a raised step or threshold. Trim any bushes or trees on the path to your home. Use bright outdoor lighting. Clear any walking paths of anything that might make someone trip, such as rocks or tools. Regularly check to see if handrails are loose or broken. Make sure that both sides of any steps have handrails. Any raised decks and porches should have guardrails on the edges. Have any leaves, snow, or ice cleared regularly. Use sand or salt on walking paths during winter. Clean up any spills in your garage right away. This includes oil or grease spills. What can I do in the bathroom? Use night lights. Install grab bars by the toilet and in the tub and shower. Do not use towel bars as grab bars. Use non-skid mats or decals in the tub or shower. If you need to sit down in the shower, use a plastic, non-slip stool. Keep the floor dry. Clean up any water that spills on the floor as soon as it happens. Remove soap buildup in the tub or shower regularly. Attach bath mats securely with double-sided non-slip rug tape. Do not have throw rugs and other things on the floor that can make you trip. What can I do in the bedroom? Use night lights. Make sure that you have a light by your bed that is easy to reach. Do not use any sheets or blankets that are too big for your bed. They should not hang down onto the floor. Have a firm chair that has side arms. You can use this for  support while you get dressed. Do not have throw rugs and other things on the floor that can make you trip. What can I do in the kitchen? Clean up any spills right away. Avoid walking on wet floors. Keep items that you use a lot in easy-to-reach places. If you need to reach something above you, use a strong step stool that has a grab bar. Keep electrical cords out of the way. Do not use floor polish or wax that makes floors slippery. If you must use wax, use non-skid floor wax. Do not have throw rugs and other things on the floor that can make you trip. What can I do with my stairs? Do not leave any items on the stairs. Make sure that there are handrails on both sides of the stairs and use them. Fix handrails that are broken or loose. Make sure that handrails are as long as the stairways. Check any carpeting to make sure that it is firmly attached to the stairs. Fix any carpet that is loose or worn. Avoid having throw rugs at the top or bottom of the stairs. If you do have throw rugs, attach them to the floor with carpet tape. Make sure that you have a light switch at the top of the stairs and the bottom of the stairs. If you do  not have them, ask someone to add them for you. What else can I do to help prevent falls? Wear shoes that: Do not have high heels. Have rubber bottoms. Are comfortable and fit you well. Are closed at the toe. Do not wear sandals. If you use a stepladder: Make sure that it is fully opened. Do not climb a closed stepladder. Make sure that both sides of the stepladder are locked into place. Ask someone to hold it for you, if possible. Clearly mark and make sure that you can see: Any grab bars or handrails. First and last steps. Where the edge of each step is. Use tools that help you move around (mobility aids) if they are needed. These include: Canes. Walkers. Scooters. Crutches. Turn on the lights when you go into a dark area. Replace any light bulbs as soon  as they burn out. Set up your furniture so you have a clear path. Avoid moving your furniture around. If any of your floors are uneven, fix them. If there are any pets around you, be aware of where they are. Review your medicines with your doctor. Some medicines can make you feel dizzy. This can increase your chance of falling. Ask your doctor what other things that you can do to help prevent falls. This information is not intended to replace advice given to you by your health care provider. Make sure you discuss any questions you have with your health care provider. Document Released: 08/30/2009 Document Revised: 04/10/2016 Document Reviewed: 12/08/2014 Elsevier Interactive Patient Education  2017 [employer].

## 2023-11-19 ENCOUNTER — Encounter: Payer: Self-pay | Admitting: Cardiology

## 2023-11-19 ENCOUNTER — Ambulatory Visit: Payer: 59 | Attending: Cardiology | Admitting: Cardiology

## 2023-11-19 VITALS — BP 189/73 | HR 71 | Resp 16 | Ht 60.0 in | Wt 159.0 lb

## 2023-11-19 DIAGNOSIS — E7800 Pure hypercholesterolemia, unspecified: Secondary | ICD-10-CM | POA: Diagnosis not present

## 2023-11-19 DIAGNOSIS — I10 Essential (primary) hypertension: Secondary | ICD-10-CM

## 2023-11-19 DIAGNOSIS — R072 Precordial pain: Secondary | ICD-10-CM

## 2023-11-19 DIAGNOSIS — G459 Transient cerebral ischemic attack, unspecified: Secondary | ICD-10-CM

## 2023-11-19 DIAGNOSIS — R42 Dizziness and giddiness: Secondary | ICD-10-CM | POA: Diagnosis not present

## 2023-11-19 MED ORDER — VALSARTAN 160 MG PO TABS: 160.0000 mg | ORAL_TABLET | Freq: Every evening | ORAL | 3 refills | Status: DC

## 2023-11-19 MED ORDER — CARVEDILOL 6.25 MG PO TABS: 6.2500 mg | ORAL_TABLET | Freq: Two times a day (BID) | ORAL | 3 refills | Status: DC

## 2023-11-19 MED ORDER — AMLODIPINE BESYLATE 10 MG PO TABS: 10.0000 mg | ORAL_TABLET | Freq: Every morning | ORAL | 3 refills | Status: DC

## 2023-11-19 NOTE — Patient Instructions (Addendum)
 Medication Instructions:  Your physician has recommended you make the following change in your medication:   INCREASE Valsartan  to 160 mg once daily in the evening  INCREASE Carvedilol  to 6.25 mg twice daily   INCREASE Amlodipine  to 10 mg once daily in the morning   Medication to take in the morning:  - Chlorthalidone   - Amlodipine    - Carvedilol  (1st dose)  Medications to take in the evening:  - Carvedilol  (2nd dose)  - Valsartan    *If you need a refill on your cardiac medications before your next appointment, please call your pharmacy*  Lab Work: None ordered today. If you have labs (blood work) drawn today and your tests are completely normal, you will receive your results only by: MyChart Message (if you have MyChart) OR A paper copy in the mail If you have any lab test that is abnormal or we need to change your treatment, we will call you to review the results.  Testing/Procedures: Your physician has requested that you have a renal artery duplex. During this test, an ultrasound is used to evaluate blood flow to the kidneys. Allow one hour for this exam. Do not eat after midnight the day before and avoid carbonated beverages. Take your medications as you usually do.  Your physician has requested that you have an echocardiogram. Echocardiography is a painless test that uses sound waves to create images of your heart. It provides your doctor with information about the size and shape of your heart and how well your heart's chambers and valves are working. This procedure takes approximately one hour. There are no restrictions for this procedure. Please do NOT wear cologne, perfume, aftershave, or lotions (deodorant is allowed). Please arrive 15 minutes prior to your appointment time.  Please note: We ask at that you not bring children with you during ultrasound (echo/ vascular) testing. Due to room size and safety concerns, children are not allowed in the ultrasound rooms during  exams. Our front office staff cannot provide observation of children in our lobby area while testing is being conducted. An adult accompanying a patient to their appointment will only be allowed in the ultrasound room at the discretion of the ultrasound technician under special circumstances. We apologize for any inconvenience.  Your physician has requested that you have a coronary calcium  score performed. This is not covered by insurance and will be an out-of-pocket cost of approximately $99.   Your physician has recommended that you have a sleep study. This test records several body functions during sleep, including: brain activity, eye movement, oxygen and carbon dioxide blood levels, heart rate and rhythm, breathing rate and rhythm, the flow of air through your mouth and nose, snoring, body muscle movements, and chest and belly movement.   Follow-Up: At [facility], you and your health needs are our priority.  As part of our continuing mission to provide you with exceptional heart care, we have created designated Provider Care Teams.  These Care Teams include your primary Cardiologist (physician) and Advanced Practice Providers (APPs -  Physician Assistants and Nurse Practitioners) who all work together to provide you with the care you need, when you need it.  We recommend signing up for the patient portal called MyChart.  Sign up information is provided on this After Visit Summary.  MyChart is used to connect with patients for Virtual Visits (Telemedicine).  Patients are able to view lab/test results, encounter notes, upcoming appointments, etc.  Non-urgent messages can be sent to your provider as well.  To learn more about what you can do with MyChart, go to [email or web address].    Your next appointment:   8 week(s)  The format for your next appointment:   In Person  Provider:   Orren Fabry, PA-C, Dayna Dunn, PA-C, Jackee Alberts, NP, Olivia Pavy, PA-C, Rosaline Bane, NP, Glendia Ferrier, PA-C, or Artist Pouch, PA-C. Then, [employer], DO will plan to see you again in 3 month(s).{

## 2023-11-19 NOTE — Progress Notes (Signed)
 Cardiology Office Note:    Date:  11/19/2023  NAME:  Ashley [name]    MRN: [id number] DOB:  [birth year]   PCP:  de [place], Raymond J, MD  Former Cardiology Providers: N/A Primary Cardiologist:  Madonna Large, DO, [facility] (established care 11/19/2023) Electrophysiologist:  None   Referring MD: Ula Prentice SAUNDERS, MD  Reason of Consult: Lightheadedness  Chief Complaint  Patient presents with   Dizziness   New Patient (Initial Visit)   Hypertension    History of Present Illness:    Ashley [name] is a 78 y.o.  female whose past medical history and cardiovascular risk factors includes: Hypertension, HLD, gastritis, osteopenia, TIA. She is being seen today for the evaluation of dizziness and labile hypertension at the request of Ula Prentice SAUNDERS, MD.  She is accompanied by a Spanish interpreter Mrs. Zolia during today's encounter.  Hypertension: Diagnosed approximately 20 years ago, per patient. In the past well-controlled on medical therapy. Patient defines blood pressure very well-controlled in the range between 140-150 mmHg. However recently she has noted elevated blood pressure readings at home on current medical therapy. She uses a wrist cuff to check her blood pressures. She endorses compliance with her current medical regimen which is as follows:  Current BP meds:  Norvasc  5mg  po AM Coreg  3.125mg  po AM (once a day) Chlorthalidone  25mg  po PM Valsartan  80mg  po PM   Chest pain: Located substernally. Pressure-like sensation. Occurs randomly. Not brought on by effort related activities, does not resolve with rest. Usually self-limited. At times improves with walking.  Patient states that her blood pressures are usually high when she is anxious and worried.  Her blood pressures are very enough she experiences lightheadedness, dizziness, and blurred vision.    Current Medications: Current Meds  Medication Sig   aspirin  EC 81 MG tablet Take 81 mg by mouth daily. Swallow whole.    atorvastatin  (LIPITOR) 20 MG tablet Take 1 tablet (20 mg total) by mouth daily.   chlorthalidone  (HYGROTON ) 25 MG tablet Take 1 tablet (25 mg total) by mouth daily.   Cholecalciferol  50 MCG (2000 UT) TABS Take 2,000 Units by mouth daily.   diclofenac Sodium (VOLTAREN) 1 % GEL Apply 4 g topically 4 (four) times daily. Use for both knees   esomeprazole  (NEXIUM ) 20 MG capsule Take 1 capsule (20 mg total) by mouth at bedtime.   magnesium  oxide (MAG-OX) 400 MG tablet Take 1 tablet by mouth daily.   zoledronic acid (RECLAST) 5 MG/100ML SOLN injection Inject 5 mg into the vein once.   [DISCONTINUED] amLODipine  (NORVASC ) 5 MG tablet Take 1 tablet (5 mg total) by mouth daily.   [DISCONTINUED] carvedilol  (COREG ) 3.125 MG tablet Take 1 tablet (3.125 mg total) by mouth 2 (two) times daily with a meal.   [DISCONTINUED] valsartan  (DIOVAN ) 80 MG tablet Take 1 tablet (80 mg total) by mouth daily.     Allergies:    Patient has no known allergies.   Past Medical History: Past Medical History:  Diagnosis Date   Amaurosis fugax    Carotid body tumor (HCC)    GERD (gastroesophageal reflux disease)    Hyperlipidemia    Hypertension    Meningioma (HCC)    Mood disorder (HCC)    Osteoporosis    Paraganglioma (HCC)    SVT (supraventricular tachycardia) (HCC)    TIA (transient ischemic attack)     Past Surgical History: Past Surgical History:  Procedure Laterality Date   CHOLECYSTECTOMY     HERNIA REPAIR  IR TRANSCATH/EMBOLIZ     OTHER SURGICAL HISTORY  09/14/2018   carotid lesion    Social History: Social History   Tobacco Use   Smoking status: Never    Passive exposure: Never   Smokeless tobacco: Never  Vaping Use   Vaping status: Never Used  Substance Use Topics   Alcohol use: No   Drug use: No    Family History: Family History  Problem Relation Age of Onset   CAD Father 60   Colon cancer Sister    Breast cancer Sister    Head & neck cancer Brother    CAD Brother 7        MI    ROS:   Review of Systems  Cardiovascular:  Positive for chest pain. Negative for claudication, irregular heartbeat, leg swelling, near-syncope, orthopnea, palpitations, paroxysmal nocturnal dyspnea and syncope.  Respiratory:  Positive for snoring. Negative for shortness of breath.   Hematologic/Lymphatic: Negative for bleeding problem.  Neurological:  Positive for headaches and light-headedness.    EKGs/Labs/Other Studies Reviewed:   EKG: EKG Interpretation Date/Time:  Thursday November 19 2023 14:53:11 EST Ventricular Rate:  66 PR Interval:  164 QRS Duration:  104 QT Interval:  420 QTC Calculation: 440 R Axis:   -44  Text Interpretation: Normal sinus rhythm Left axis deviation Incomplete right bundle branch block When compared with ECG of 21-Sep-2023 15:56, T wave inversion no longer evident in Inferior leads Confirmed by Michele Richardson [id number]) on 11/19/2023 2:59:58 PM  Echocardiogram: N/A   Labs:    Latest Ref Rng & Units 09/21/2023    5:19 PM 12/21/2017   12:28 PM 12/21/2017   12:11 PM  CBC  WBC 4.0 - 10.5 K/uL 8.1   5.4   Hemoglobin 12.0 - 15.0 g/dL 86.7  87.0  87.2   Hematocrit 36.0 - 46.0 % 40.4  38.0  38.8   Platelets 150 - 400 K/uL 138   166        Latest Ref Rng & Units 09/21/2023    5:19 PM 08/15/2022    2:01 PM 12/21/2017   12:28 PM  BMP  Glucose 70 - 99 mg/dL 95  893  81   BUN 8 - 23 mg/dL 14  16  8    Creatinine 0.44 - 1.00 mg/dL 9.47  9.48  9.49   BUN/Creat Ratio 6 - 22 (calc)  31    Sodium 135 - 145 mmol/L 138  135  141   Potassium 3.5 - 5.1 mmol/L 4.4  3.7  3.9   Chloride 98 - 111 mmol/L 101  94  105   CO2 22 - 32 mmol/L 26  31    Calcium  8.9 - 10.3 mg/dL 9.3  9.9        Latest Ref Rng & Units 09/21/2023    5:19 PM 08/15/2022    2:01 PM 12/21/2017   12:28 PM  CMP  Glucose 70 - 99 mg/dL 95  893  81   BUN 8 - 23 mg/dL 14  16  8    Creatinine 0.44 - 1.00 mg/dL 9.47  9.48  9.49   Sodium 135 - 145 mmol/L 138  135  141   Potassium 3.5 - 5.1 mmol/L 4.4   3.7  3.9   Chloride 98 - 111 mmol/L 101  94  105   CO2 22 - 32 mmol/L 26  31    Calcium  8.9 - 10.3 mg/dL 9.3  9.9    Total Protein 6.5 - 8.1 g/dL 7.1  Total Bilirubin <1.2 mg/dL 1.0     Alkaline Phos 38 - 126 U/L 50     AST 15 - 41 U/L 26     ALT 0 - 44 U/L 25       Lab Results  Component Value Date   CHOL 146 08/15/2022   HDL 63 08/15/2022   LDLCALC 64 08/15/2022   TRIG 104 08/15/2022   CHOLHDL 2.3 08/15/2022   No results for input(s): LIPOA in the last 8760 hours. No components found for: NTPROBNP No results for input(s): PROBNP in the last 8760 hours. Recent Labs    09/21/23 1720  TSH 2.081    Physical Exam:    Today's Vitals   11/19/23 1446 11/19/23 1454 11/19/23 1456 11/19/23 1458  BP: (!) 189/73     Pulse: 71     Resp: 16     SpO2:  94% 95% 96%  Weight: 159 lb (72.1 kg)     Height: 5' (1.524 m)      Body mass index is 31.05 kg/m. Wt Readings from Last 3 Encounters:  11/19/23 159 lb (72.1 kg)  10/08/23 153 lb 14.4 oz (69.8 kg)  09/25/23 153 lb (69.4 kg)    Orthostatic VS for the past 72 hrs (Last 3 readings):  Orthostatic BP Patient Position BP Location Cuff Size Orthostatic Pulse  11/19/23 1458 181/77 Standing Left Arm Large 69  11/19/23 1456 187/77 Sitting Left Arm Large 66  11/19/23 1454 175/71 Supine Left Arm Large 67     Physical Exam  Constitutional: No distress.  hemodynamically stable  Neck: No JVD present.  Cardiovascular: Normal rate, regular rhythm, S1 normal and S2 normal. Exam reveals no gallop, no S3 and no S4.  No murmur heard. Pulmonary/Chest: Effort normal and breath sounds normal. No stridor. She has no wheezes. She has no rales.  Abdominal: Soft. Bowel sounds are normal. She exhibits no distension. There is no abdominal tenderness.  Musculoskeletal:        General: No edema.     Cervical back: Neck supple.  Neurological: She is alert and oriented to person, place, and time. She has intact cranial nerves (2-12).  Skin:  Skin is warm.     Impression & Recommendation(s):  Impression:   ICD-10-CM   1. Hypertension, benign  I10 VAS US  RENAL ARTERY DUPLEX    Ambulatory referral to Sleep Studies    Basic Metabolic Panel (BMET)    Basic Metabolic Panel (BMET)    CANCELED: [facility]    2. Precordial pain  R07.2 ECHOCARDIOGRAM COMPLETE    CT CARDIAC SCORING (SELF PAY ONLY)    3. Lightheadedness  R42 EKG 12-Lead    4. Pure hypercholesterolemia  E78.00     5. TIA (transient ischemic attack)  G45.9        Recommendation(s):  Hypertension, benign Office and home blood pressures are not well-controlled. Ambulatory blood pressure readings reviewed-noted in the media section. Patient does not endorse high salt diet. Continue chlorthalidone  25 mg p.o. AM Increase valsartan  to 160 mg p.o. every afternoon Increase amlodipine  to 10 mg p.o. every morning Change carvedilol  3.125 mg p.o. daily to 6.25 mg p.o. twice daily Renal duplex to evaluate for renal artery stenosis. Referral to sleep medicine for evaluation of sleep apnea Recommend a goal SBP of 130 mmHg.  Precordial pain Predominantly noncardiac. But has multiple cardiovascular risk factors. Echo will be ordered to evaluate for structural heart disease and left ventricular systolic function. Coronary artery calcium  score for  further risk stratification Once his blood pressures are better controlled further discuss ischemic workup if symptoms persist.  Lightheadedness Orthostatic vital signs negative. Root cause unknown but likely secondary to elevated blood pressures Patient is asked to seek medical attention if she ever experiences pain between his shoulder blades, sudden syncope, focal neurological deficits, projectile vomiting, or visual disturbances to rule out acute coronary syndromes and/or strokes. Patient has had history of TIAs.  Pure hypercholesterolemia Currently on atorvastatin  20 mg p.o. daily.   She denies myalgia or other  side effects. Currently managed by primary care provider.  TIA (transient ischemic attack) Secondary prevention.   Orders Placed:  Orders Placed This Encounter  Procedures   CT CARDIAC SCORING (SELF PAY ONLY)    Standing Status:   Future    Expiration Date:   11/18/2024    Preferred imaging location?:   [place]   Basic Metabolic Panel (BMET)    Standing Status:   Future    Number of Occurrences:   1    Expected Date:   11/26/2023    Expiration Date:   11/18/2024   Ambulatory referral to Sleep Studies    Referral Priority:   Routine    Referral Type:   Consultation    Referral Reason:   Specialty Services Required    Number of Visits Requested:   1   EKG 12-Lead   ECHOCARDIOGRAM COMPLETE    Standing Status:   Future    Expected Date:   11/26/2023    Expiration Date:   11/18/2024    Where should this test be performed:   [facility])    Does the patient weigh less than or greater than 250 lbs?:   Patient weighs less than 250 lbs    Perflutren DEFINITY (image enhancing agent) should be administered unless hypersensitivity or allergy exist:   Administer Perflutren    Reason for exam-Echo:   Chest Pain  R07.9    Final Medication List:    Meds ordered this encounter  Medications   amLODipine  (NORVASC ) 10 MG tablet    Sig: Take 1 tablet (10 mg total) by mouth in the morning.    Dispense:  90 tablet    Refill:  3    Increasing to 10 mg   carvedilol  (COREG ) 6.25 MG tablet    Sig: Take 1 tablet (6.25 mg total) by mouth 2 (two) times daily with a meal.    Dispense:  90 tablet    Refill:  3    Increasing dose and increasing frequency   valsartan  (DIOVAN ) 160 MG tablet    Sig: Take 1 tablet (160 mg total) by mouth every evening.    Dispense:  90 tablet    Refill:  3    Increasing dose    Medications Discontinued During This Encounter  Medication Reason   amLODipine  (NORVASC ) 5 MG tablet    carvedilol  (COREG ) 3.125 MG tablet    valsartan  (DIOVAN ) 80 MG  tablet      Current Outpatient Medications:    aspirin  EC 81 MG tablet, Take 81 mg by mouth daily. Swallow whole., Disp: , Rfl:    atorvastatin  (LIPITOR) 20 MG tablet, Take 1 tablet (20 mg total) by mouth daily., Disp: 90 tablet, Rfl: 1   chlorthalidone  (HYGROTON ) 25 MG tablet, Take 1 tablet (25 mg total) by mouth daily., Disp: 90 tablet, Rfl: 1   Cholecalciferol  50 MCG (2000 UT) TABS, Take 2,000 Units by mouth daily., Disp: , Rfl:  diclofenac Sodium (VOLTAREN) 1 % GEL, Apply 4 g topically 4 (four) times daily. Use for both knees, Disp: , Rfl:    esomeprazole  (NEXIUM ) 20 MG capsule, Take 1 capsule (20 mg total) by mouth at bedtime., Disp: 90 capsule, Rfl: 1   magnesium  oxide (MAG-OX) 400 MG tablet, Take 1 tablet by mouth daily., Disp: , Rfl:    zoledronic acid (RECLAST) 5 MG/100ML SOLN injection, Inject 5 mg into the vein once., Disp: , Rfl:    amLODipine  (NORVASC ) 10 MG tablet, Take 1 tablet (10 mg total) by mouth in the morning., Disp: 90 tablet, Rfl: 3   carvedilol  (COREG ) 6.25 MG tablet, Take 1 tablet (6.25 mg total) by mouth 2 (two) times daily with a meal., Disp: 90 tablet, Rfl: 3   valsartan  (DIOVAN ) 160 MG tablet, Take 1 tablet (160 mg total) by mouth every evening., Disp: 90 tablet, Rfl: 3  Consent:   N/A  Disposition:   8 weeks with APP to see if further medication titration for blood pressure is needed & to discuss ischemic workup based on test results and symptoms  3 months with myself.  Patient may be asked to follow-up sooner based on the results of the above-mentioned testing.  Her questions and concerns were addressed to her satisfaction. She voices understanding of the recommendations provided during this encounter.    Signed, Madonna Large, DO, [facility]  [facility]  [address] [place], [place] [address] 11/19/2023 6:48 PM

## 2023-11-20 ENCOUNTER — Telehealth: Payer: Self-pay

## 2023-11-20 NOTE — Telephone Encounter (Signed)
 Spoke with pt through interpreter with ID of [phone] with [facility] and let the pt know that she has an 8 week f/u appt scheduled with Jackee Alberts, NP on 3/3 at 1:30 PM and a 3 month f/u appt with Dr. Michele on 4/14 at 2 PM. Pt verbalized understanding and was given our office number to call with any questions.

## 2023-11-26 LAB — BASIC METABOLIC PANEL
BUN/Creatinine Ratio: 25 (ref 12–28)
BUN: 14 mg/dL (ref 8–27)
CO2: 25 mmol/L (ref 20–29)
Calcium: 9.1 mg/dL (ref 8.7–10.3)
Chloride: 101 mmol/L (ref 96–106)
Creatinine, Ser: 0.56 mg/dL — ABNORMAL LOW (ref 0.57–1.00)
Glucose: 81 mg/dL (ref 70–99)
Potassium: 3.9 mmol/L (ref 3.5–5.2)
Sodium: 141 mmol/L (ref 134–144)
eGFR: 93 mL/min/{1.73_m2} (ref >59)

## 2023-12-01 ENCOUNTER — Ambulatory Visit (HOSPITAL_COMMUNITY)
Admission: RE | Admit: 2023-12-01 | Discharge: 2023-12-01 | Disposition: A | Discharge status: Home / Self Care | Payer: 59 | Source: Ambulatory Visit | Attending: Cardiology | Admitting: Cardiology

## 2023-12-01 DIAGNOSIS — R072 Precordial pain: Secondary | ICD-10-CM | POA: Insufficient documentation

## 2023-12-03 ENCOUNTER — Telehealth: Payer: Self-pay

## 2023-12-03 NOTE — Telephone Encounter (Signed)
Pt walked in to clinic this afternoon with daughter stating that she was concerned about LE edema after increasing her Losartan from her last OV. Pt stated her BP has been better since increasing her Losartan dose but still has elevated readings at times. Pt stated she eats a low salt diet. Explained to pt and daughter that we have a policy against walk-in appts but that if the pt developed SOB, CP, she would need to go to ED to be evaluated. Advised pt to continue to watch salt intake, elevate legs when sitting, and buy compression socks to wear and help with edema. Explained to pt that I would forward this information to Dr. Odis Hollingshead for recommendations. Pt and pt's daughter verbalized understanding and had no further questions at this time.

## 2023-12-10 ENCOUNTER — Other Ambulatory Visit (HOSPITAL_BASED_OUTPATIENT_CLINIC_OR_DEPARTMENT_OTHER): Payer: Self-pay

## 2023-12-10 ENCOUNTER — Ambulatory Visit (INDEPENDENT_AMBULATORY_CARE_PROVIDER_SITE_OTHER): Payer: 59 | Admitting: Family Medicine

## 2023-12-10 ENCOUNTER — Encounter (HOSPITAL_BASED_OUTPATIENT_CLINIC_OR_DEPARTMENT_OTHER): Payer: Self-pay | Admitting: Family Medicine

## 2023-12-10 VITALS — BP 127/60 | HR 59 | Ht 60.0 in | Wt 157.9 lb

## 2023-12-10 DIAGNOSIS — I10 Essential (primary) hypertension: Secondary | ICD-10-CM | POA: Diagnosis not present

## 2023-12-10 MED ORDER — AMLODIPINE BESYLATE 5 MG PO TABS
5.0000 mg | ORAL_TABLET | Freq: Every morning | ORAL | 1 refills | Status: DC
  Filled 2023-12-10 (×2): qty 90, 90d supply, fill #0

## 2023-12-10 NOTE — Progress Notes (Signed)
    Procedures performed today:    None.  Independent interpretation of notes and tests performed by another provider:   None.  Brief History, Exam, Impression, and Recommendations:    BP 127/60 (BP Location: Right Arm, Patient Position: Sitting, Cuff Size: Normal)   Pulse (!) 59   Ht 5' (1.524 m)   Wt 157 lb 14.4 oz (71.6 kg)   SpO2 98%   BMI 30.84 kg/m   In person interpreter utilized during duration of appointment  Hypertension, benign Assessment & Plan: Earlier this month, patient had appointment with her cardiologist.  Blood pressure was notably elevated at that time and medications were adjusted including increased dose of carvedilol and valsartan as well as amlodipine.  She indicates that since that time she has had more trouble with bilateral lower extremity swelling which has been painful for patient.  She indicates that prior to these medication changes she had very mild lower extremity swelling that did not cause her any pain or symptoms.  She did attempt to be seen at cardiology office, however they did not have any appointments available. Today, blood pressure is well-controlled.  She has not had any issues with lightheadedness or dizziness.  We reviewed medication changes made by cardiology office, discussed that most likely cause for new symptoms is related to increased dose of amlodipine.  Given this, we will decrease dose to 5 mg and monitor response.  Did discuss that with observed lower extremity edema, it is possible that symptoms may still be bothersome at 5 mg dose even though it was tolerated previously.  If this is the case, we can look to further decrease to 2.5 mg dose.  She is scheduled for follow-up in about 1 month.  Can return to office sooner as needed   Other orders -     amLODIPine Besylate; Take 1 tablet (5 mg total) by mouth in the morning.  Dispense: 90 tablet; Refill: 1  Return if symptoms worsen or fail to  improve.   ___________________________________________ Ashley [name] de [place], MD, ABFM, CAQSM Primary Care and Sports Medicine [facility]

## 2023-12-10 NOTE — Assessment & Plan Note (Signed)
Earlier this month, patient had appointment with her cardiologist.  Blood pressure was notably elevated at that time and medications were adjusted including increased dose of carvedilol and valsartan as well as amlodipine.  She indicates that since that time she has had more trouble with bilateral lower extremity swelling which has been painful for patient.  She indicates that prior to these medication changes she had very mild lower extremity swelling that did not cause her any pain or symptoms.  She did attempt to be seen at cardiology office, however they did not have any appointments available. Today, blood pressure is well-controlled.  She has not had any issues with lightheadedness or dizziness.  We reviewed medication changes made by cardiology office, discussed that most likely cause for new symptoms is related to increased dose of amlodipine.  Given this, we will decrease dose to 5 mg and monitor response.  Did discuss that with observed lower extremity edema, it is possible that symptoms may still be bothersome at 5 mg dose even though it was tolerated previously.  If this is the case, we can look to further decrease to 2.5 mg dose.  She is scheduled for follow-up in about 1 month.  Can return to office sooner as needed

## 2023-12-10 NOTE — Patient Instructions (Signed)
  Medication Instructions:  Your physician recommends that you continue on your current medications as directed. Please refer to the Current Medication list given to you today. --If you need a refill on any your medications before your next appointment, please call your pharmacy first. If no refills are authorized on file call the office.--   Follow-Up: Your next appointment:   Your physician recommends that you schedule a follow-up appointment in: keep appt scheduled with Dr. de [place]  You will receive a text message or e-mail with a link to a survey about your care and experience with [place] today! We would greatly appreciate your feedback!   Thanks for letting us be apart of your health journey!!  Primary Care and Sports Medicine   Dr. Ceasar Mons [place]   We encourage you to activate your patient portal called "MyChart".  Sign up information is provided on this After Visit Summary.  MyChart is used to connect with patients for Virtual Visits (Telemedicine).  Patients are able to view lab/test results, encounter notes, upcoming appointments, etc.  Non-urgent messages can be sent to your provider as well. To learn more about what you can do with MyChart, please visit --  [email or web address].

## 2023-12-18 ENCOUNTER — Ambulatory Visit (HOSPITAL_BASED_OUTPATIENT_CLINIC_OR_DEPARTMENT_OTHER)
Admission: RE | Admit: 2023-12-18 | Discharge: 2023-12-18 | Disposition: A | Discharge status: Home / Self Care | Payer: 59 | Source: Ambulatory Visit | Attending: Cardiology | Admitting: Cardiology

## 2023-12-18 ENCOUNTER — Ambulatory Visit (HOSPITAL_COMMUNITY)
Admission: RE | Admit: 2023-12-18 | Discharge: 2023-12-18 | Disposition: A | Discharge status: Home / Self Care | Payer: 59 | Source: Ambulatory Visit | Attending: Cardiology | Admitting: Cardiology

## 2023-12-18 DIAGNOSIS — I10 Essential (primary) hypertension: Secondary | ICD-10-CM

## 2023-12-18 DIAGNOSIS — R072 Precordial pain: Secondary | ICD-10-CM | POA: Insufficient documentation

## 2023-12-18 LAB — ECHOCARDIOGRAM COMPLETE
AR max vel: 1.76 cm2
AV Area VTI: 1.65 cm2
AV Area mean vel: 1.54 cm2
AV Mean grad: 3 mm[Hg]
AV Peak grad: 6.3 mm[Hg]
Ao pk vel: 1.25 m/s
Area-P 1/2: 2.5 cm2
MV M vel: 0.79 m/s
MV Peak grad: 2.5 mm[Hg]
S' Lateral: 2.41 cm

## 2024-01-07 ENCOUNTER — Ambulatory Visit: Payer: 59 | Admitting: Neurology

## 2024-01-11 ENCOUNTER — Encounter (HOSPITAL_BASED_OUTPATIENT_CLINIC_OR_DEPARTMENT_OTHER): Payer: Self-pay | Admitting: Family Medicine

## 2024-01-11 ENCOUNTER — Ambulatory Visit (INDEPENDENT_AMBULATORY_CARE_PROVIDER_SITE_OTHER): Payer: 59 | Admitting: Family Medicine

## 2024-01-11 VITALS — BP 161/61 | HR 59 | Ht 60.0 in | Wt 156.6 lb

## 2024-01-11 DIAGNOSIS — I10 Essential (primary) hypertension: Secondary | ICD-10-CM

## 2024-01-11 MED ORDER — AMLODIPINE BESYLATE 2.5 MG PO TABS
2.5000 mg | ORAL_TABLET | Freq: Every morning | ORAL | 1 refills | Status: DC
Start: 2024-01-11 — End: 2024-01-18

## 2024-01-11 NOTE — Assessment & Plan Note (Signed)
 Blood pressure elevated in office, did improve some slightly on recheck, however continues to be above goal.  She has continued with lower dose of amlodipine since last office visit, however continues to have notable swelling of bilateral lower extremities.  Did not notice significant change with decreased dose of medication.  Has been having mild headache.  She does have appointment upcoming with cardiology next week. We discussed options and we will proceed with further decrease in dose of amlodipine.  Did discuss that despite this decrease, it is still possible that swelling may persist and ultimately patient may need to stop medication completely.  We also discussed possibility of increasing dose of valsartan to further help with controlling blood pressure given elevation.  Patient is reluctant to make this change and would prefer to hold off on this for now.  This is something that can be further reviewed when she meets with cardiology for upcoming appointment.  Recommend intermittent monitoring of blood pressure at home, DASH diet

## 2024-01-11 NOTE — Patient Instructions (Signed)
  Medication Instructions:  Your physician recommends that you continue on your current medications as directed. Please refer to the Current Medication list given to you today. --If you need a refill on any your medications before your next appointment, please call your pharmacy first. If no refills are authorized on file call the office.--   Follow-Up: Your next appointment:   Your physician recommends that you schedule a follow-up appointment in: 6 week follow up  with Dr. de Peru  You will receive a text message or e-mail with a link to a survey about your care and experience with [place] today! We would greatly appreciate your feedback!   Thanks for letting us be apart of your health journey!!  Primary Care and Sports Medicine   Dr. Ceasar Mons Peru   We encourage you to activate your patient portal called "MyChart".  Sign up information is provided on this After Visit Summary.  MyChart is used to connect with patients for Virtual Visits (Telemedicine).  Patients are able to view lab/test results, encounter notes, upcoming appointments, etc.  Non-urgent messages can be sent to your provider as well. To learn more about what you can do with MyChart, please visit --  [email or web address].

## 2024-01-11 NOTE — Progress Notes (Signed)
    Procedures performed today:    None.  Independent interpretation of notes and tests performed by another provider:   None.  Brief History, Exam, Impression, and Recommendations:    BP (!) 161/61 (BP Location: Left Arm, Patient Position: Sitting, Cuff Size: Normal)   Pulse (!) 59   Ht 5' (1.524 m)   Wt 156 lb 9.6 oz (71 kg)   SpO2 96%   BMI 30.58 kg/m   In person interpreter utilized for duration of appointment  Hypertension, benign Assessment & Plan: Blood pressure elevated in office, did improve some slightly on recheck, however continues to be above goal.  She has continued with lower dose of amlodipine since last office visit, however continues to have notable swelling of bilateral lower extremities.  Did not notice significant change with decreased dose of medication.  Has been having mild headache.  She does have appointment upcoming with cardiology next week. We discussed options and we will proceed with further decrease in dose of amlodipine.  Did discuss that despite this decrease, it is still possible that swelling may persist and ultimately patient may need to stop medication completely.  We also discussed possibility of increasing dose of valsartan to further help with controlling blood pressure given elevation.  Patient is reluctant to make this change and would prefer to hold off on this for now.  This is something that can be further reviewed when she meets with cardiology for upcoming appointment.  Recommend intermittent monitoring of blood pressure at home, DASH diet   Other orders -     amLODIPine Besylate; Take 1 tablet (2.5 mg total) by mouth in the morning.  Dispense: 30 tablet; Refill: 1  Return in about 6 weeks (around 02/22/2024) for hypertension.   ___________________________________________ Dalary Hollar de Peru, MD, ABFM, CAQSM Primary Care and Sports Medicine [facility]

## 2024-01-17 NOTE — Progress Notes (Unsigned)
 Cardiology Office Note    Patient Name: Ashley [name] Date of Encounter: 01/17/2024  Primary Care Provider:  de [place], Buren Kos, MD Primary Cardiologist:  Tessa Lerner, DO Primary Electrophysiologist: None   Past Medical History    Past Medical History:  Diagnosis Date   Amaurosis fugax    Carotid body tumor (HCC)    GERD (gastroesophageal reflux disease)    Hyperlipidemia    Hypertension    Meningioma (HCC)    Mood disorder (HCC)    Osteoporosis    Paraganglioma (HCC)    SVT (supraventricular tachycardia) (HCC)    TIA (transient ischemic attack)     History of Present Illness  Ashley [name] is a 78 y.o. female with a PMH of HTN, HLD, GERD, TIA, SVT who presents today for 17-month follow-up.  Ashley [name] was seen initially by Dr. Billy Coast in 11/19/2023 for complaint of dizziness and labile blood pressures.  During her visit she reported blood pressures being elevated at home on current medication.  She uses a wrist cuff to check her blood pressures and was currently on for BP medications.  She also endorsed episodes of lightheadedness and orthostatic blood pressures were normal.  She reported that her blood pressure is usually high when anxious and worried.  During her visit her blood pressure was 189/73 and carvedilol was increased to 6.25 mg twice daily and renal duplex was completed for further evaluation that showed no obstructive disease.  2D echo was completed to evaluate precordial pain that revealed EF of 60 to 65% with mild concentric LVH and grade 2 DD with normal PASP and mildly dilated LA with no evidence of MVR.   During today's visit the patient reports*** .  Patient denies chest pain, palpitations, dyspnea, PND, orthopnea, nausea, vomiting, dizziness, syncope, edema, weight gain, or early satiety.  ***Notes: -Last ischemic evaluation: -Last echo: -Interim ED visits: Review of Systems  Please see the history of present illness.    All other systems reviewed and  are otherwise negative except as noted above.  Physical Exam    Wt Readings from Last 3 Encounters:  01/11/24 156 lb 9.6 oz (71 kg)  12/10/23 157 lb 14.4 oz (71.6 kg)  11/19/23 159 lb (72.1 kg)   RU:EAVWU were no vitals filed for this visit.,There is no height or weight on file to calculate BMI. GEN: Well nourished, well developed in no acute distress Neck: No JVD; No carotid bruits Pulmonary: Clear to auscultation without rales, wheezing or rhonchi  Cardiovascular: Normal rate. Regular rhythm. Normal S1. Normal S2.   Murmurs: There is no murmur.  ABDOMEN: Soft, non-tender, non-distended EXTREMITIES:  No edema; No deformity   EKG/LABS/ Recent Cardiac Studies   ECG personally reviewed by me today - ***  Risk Assessment/Calculations:   {Does this patient have ATRIAL FIBRILLATION?:[phone]}  STOP-Bang Score:  4  { Consider Dx Sleep Disordered Breathing or Sleep Apnea  ICD G47.33          :1}    Lab Results  Component Value Date   WBC 8.1 09/21/2023   HGB 13.2 09/21/2023   HCT 40.4 09/21/2023   MCV 96.9 09/21/2023   PLT 138 (L) 09/21/2023   Lab Results  Component Value Date   CREATININE 0.56 (L) 11/25/2023   BUN 14 11/25/2023   NA 141 11/25/2023   K 3.9 11/25/2023   CL 101 11/25/2023   CO2 25 11/25/2023   Lab Results  Component Value Date   CHOL 146 08/15/2022   HDL  63 08/15/2022   LDLCALC 64 08/15/2022   TRIG 104 08/15/2022   CHOLHDL 2.3 08/15/2022    Lab Results  Component Value Date   HGBA1C 5.2 10/05/2023   Assessment & Plan    1.  Primary HTN:  2.  History of TIA:  3.  Precordial pain:  4.  Lightheadedness:      Disposition: Follow-up with Sunit Tolia, DO or APP in *** months {Are you ordering a CV Procedure (e.g. stress test, cath, DCCV, TEE, etc)?   Press F2        :[id number]}   Signed, Napoleon Form, Leodis Rains, NP 01/17/2024, 2:41 PM [facility]

## 2024-01-18 ENCOUNTER — Encounter: Payer: Self-pay | Admitting: Nurse Practitioner

## 2024-01-18 ENCOUNTER — Ambulatory Visit: Payer: 59 | Attending: Nurse Practitioner | Admitting: Nurse Practitioner

## 2024-01-18 VITALS — BP 138/78 | HR 68 | Ht 60.0 in | Wt 155.4 lb

## 2024-01-18 DIAGNOSIS — I10 Essential (primary) hypertension: Secondary | ICD-10-CM | POA: Diagnosis not present

## 2024-01-18 DIAGNOSIS — R072 Precordial pain: Secondary | ICD-10-CM

## 2024-01-18 DIAGNOSIS — G459 Transient cerebral ischemic attack, unspecified: Secondary | ICD-10-CM | POA: Diagnosis not present

## 2024-01-18 DIAGNOSIS — E7800 Pure hypercholesterolemia, unspecified: Secondary | ICD-10-CM | POA: Diagnosis not present

## 2024-01-18 DIAGNOSIS — R42 Dizziness and giddiness: Secondary | ICD-10-CM | POA: Diagnosis not present

## 2024-01-18 MED ORDER — CHLORTHALIDONE 25 MG PO TABS: 25.0000 mg | ORAL_TABLET | Freq: Every day | ORAL | 1 refills | Status: AC

## 2024-01-18 MED ORDER — CARVEDILOL 6.25 MG PO TABS: 6.2500 mg | ORAL_TABLET | Freq: Two times a day (BID) | ORAL | 3 refills | Status: DC

## 2024-01-18 MED ORDER — VALSARTAN 160 MG PO TABS: 160.0000 mg | ORAL_TABLET | Freq: Every evening | ORAL | 3 refills | Status: DC

## 2024-01-18 MED ORDER — ASPIRIN 81 MG PO TBEC: 81.0000 mg | DELAYED_RELEASE_TABLET | Freq: Every day | ORAL | 5 refills | Status: DC

## 2024-01-18 MED ORDER — CHOLECALCIFEROL 50 MCG (2000 UT) PO TABS: 2000.0000 [IU] | ORAL_TABLET | Freq: Every day | ORAL | 3 refills | Status: AC

## 2024-01-18 MED ORDER — ATORVASTATIN CALCIUM 20 MG PO TABS: 20.0000 mg | ORAL_TABLET | Freq: Every day | ORAL | 1 refills | Status: AC

## 2024-01-18 NOTE — Patient Instructions (Signed)
 Follow-Up: At [facility], you and your health needs are our priority.  As part of our continuing mission to provide you with exceptional heart care, we have created designated Provider Care Teams.  These Care Teams include your primary Cardiologist (physician) and Advanced Practice Providers (APPs -  Physician Assistants and Nurse Practitioners) who all work together to provide you with the care you need, when you need it.  We recommend signing up for the patient portal called "MyChart".  Sign up information is provided on this After Visit Summary.  MyChart is used to connect with patients for Virtual Visits (Telemedicine).  Patients are able to view lab/test results, encounter notes, upcoming appointments, etc.  Non-urgent messages can be sent to your provider as well.   To learn more about what you can do with MyChart, go to [email or web address].    Your next appointment:   3 months  Provider:   Robin Searing, NP  Other Instructions   Monitor blood pressure for 2 weeks. Send [place] the readings via MyChart.     1st Floor: - Lobby - Registration  - Pharmacy  - Lab - Cafe  2nd Floor: - PV Lab - Diagnostic Testing (echo, CT, nuclear med)  3rd Floor: - Vacant  4th Floor: - TCTS (cardiothoracic surgery) - AFib Clinic - Structural Heart Clinic - Vascular Surgery  - Vascular Ultrasound  5th Floor: - HeartCare Cardiology (general and EP) - Clinical Pharmacy for coumadin, hypertension, lipid, weight-loss medications, and med management appointments    Valet parking services will be available as well.

## 2024-01-19 ENCOUNTER — Telehealth: Payer: Self-pay | Admitting: Licensed Clinical Social Worker

## 2024-01-19 NOTE — Progress Notes (Unsigned)
 Heart and Vascular Care Navigation  01/19/2024  Ashley [name] 1946/07/23 [id number]  Reason for Referral: transportation resources Patient is participating in a Managed Medicaid Plan: No, UHC Medicare and Medicaid  Engaged with patient by telephone for initial visit for Heart and Vascular Care Coordination.                                                                                                   Assessment:    LCSW was able to reach pt this morning at  [phone]. Introduced self, role, reason for call. Utilized Spanish language interpreter [place], [phone] to confirm home address, PCP and emergency contact remains her daughter Ashley [name]. She lives with her and family members, moved from [facility] and states this is "temporary," but note she has been at same address for several years thus far. Pt shares no issues with food, housing/utilities, and denies any issues with obtaining or affording her medications at this time. Previously she had friends and family members assisting her with transportation but has upcoming appts she is not sure she has assistance with.   I shared with pt that she has transportation benefits through her insurance company and would need to call 3 business days before any appointments. This is free to pt. If there are additional rides pt needs to get around community (to non medical appointments) I also will send her the [employer] application. Unfortunately this is only in [place]- she shares her granddaughter who moved to [place] could help her when she visits with completing that application, as she states her daughter is very busy.   No additional questions at this time.                                     HRT/VAS Care Coordination     Patients Home Cardiology Office [facility]   Outpatient Care Team Social Worker   Social Worker Name: Octavio Graves, [place], [phone]   Living arrangements for the past 2 months Single Family  Home   Lives with: Adult Children   Patient Current Insurance Coverage Managed Medicare; Medicaid   Patient Has Concern With Paying Medical Bills No   Does Patient Have Prescription Coverage? Yes       Social History:                                                                             SDOH Screenings   Food Insecurity: No Food Insecurity (01/19/2024)  Housing: Low Risk  (01/19/2024)  Transportation Needs: Unmet Transportation Needs (01/19/2024)  Utilities: Not At Risk (01/19/2024)  Alcohol Screen: Low Risk  (10/13/2023)  Depression (PHQ2-9): Low Risk  (01/11/2024)  Financial Resource Strain: Low Risk  (01/19/2024)  Physical  Activity: Insufficiently Active (10/13/2023)  Social Connections: Moderately Integrated (10/13/2023)  Stress: No Stress Concern Present (10/13/2023)  Tobacco Use: Low Risk  (01/18/2024)  Health Literacy: Inadequate Health Literacy (01/19/2024)    SDOH Interventions: Financial Resources:  Financial Strain Interventions: Intervention Not Indicated  Food Insecurity:  Food Insecurity Interventions: Intervention Not Indicated  Housing Insecurity:  Housing Interventions: Intervention Not Indicated  Transportation:   Transportation Interventions: Patient Resources Dietitian), Payor Benefit, Forensic psychologist (Research scientist (life sciences))    Other Care Navigation Interventions:     Provided Pharmacy assistance resources  Pt denies any issues obtaining or affording medications at this time   Follow-up plan:   LCSW mailed pt the following: transportation resources in Spanish, my contact information, [employer] application (unfortunately only in [place], she states her granddaughter can assist)

## 2024-01-26 ENCOUNTER — Telehealth: Payer: Self-pay | Admitting: Licensed Clinical Social Worker

## 2024-01-26 NOTE — Telephone Encounter (Signed)
 H&V Care Navigation CSW Progress Note  Clinical Social Worker contacted patient by phone to f/u on resources mailed to her for transportation assistance. Was unable to reach her with assistance of Spanish language interpreter Amil Amen, [phone]. Voicemail set up but full so unable to leave voicemail. Will re-attempt later this week.  Patient is participating in a Managed Medicaid Plan:  No, [employer] and Medicaid  SDOH Screenings   Food Insecurity: No Food Insecurity (01/19/2024)  Housing: Low Risk  (01/19/2024)  Transportation Needs: Unmet Transportation Needs (01/19/2024)  Utilities: Not At Risk (01/19/2024)  Alcohol Screen: Low Risk  (10/13/2023)  Depression (PHQ2-9): Low Risk  (01/11/2024)  Financial Resource Strain: Low Risk  (01/19/2024)  Physical Activity: Insufficiently Active (10/13/2023)  Social Connections: Moderately Integrated (10/13/2023)  Stress: No Stress Concern Present (10/13/2023)  Tobacco Use: Low Risk  (01/18/2024)  Health Literacy: Inadequate Health Literacy (01/19/2024)    Octavio Graves, MSW, LCSW Clinical Social Worker II [facility] Heart/Vascular Care Navigation  [phone]- work cell phone (preferred) [phone]- desk phone

## 2024-01-27 ENCOUNTER — Telehealth: Payer: Self-pay | Admitting: Licensed Clinical Social Worker

## 2024-01-27 NOTE — Telephone Encounter (Signed)
 H&V Care Navigation CSW Progress Note  Clinical Social Worker contacted patient by phone to f/u on transportation resources sent to pt. Was able to reach pt today on second call to [phone] with assistance of Spanish language interpreter [place], [phone]. Confirmed received resources and understands how to use [facility] Medicare services to arrange rides. Encouraged her to call me if any issues. No additional questions at this time. Remain available as needed.   Patient is participating in a Managed Medicaid Plan:  No, UHC Medicare and Medicaid   SDOH Screenings   Food Insecurity: No Food Insecurity (01/19/2024)  Housing: Low Risk  (01/19/2024)  Transportation Needs: Unmet Transportation Needs (01/19/2024)  Utilities: Not At Risk (01/19/2024)  Alcohol Screen: Low Risk  (10/13/2023)  Depression (PHQ2-9): Low Risk  (01/11/2024)  Financial Resource Strain: Low Risk  (01/19/2024)  Physical Activity: Insufficiently Active (10/13/2023)  Social Connections: Moderately Integrated (10/13/2023)  Stress: No Stress Concern Present (10/13/2023)  Tobacco Use: Low Risk  (01/18/2024)  Health Literacy: Inadequate Health Literacy (01/19/2024)     Octavio Graves, MSW, LCSW Clinical Social Worker II [facility] Heart/Vascular Care Navigation  [phone]- work cell phone (preferred) [phone]- desk phone

## 2024-02-16 ENCOUNTER — Ambulatory Visit (INDEPENDENT_AMBULATORY_CARE_PROVIDER_SITE_OTHER): Payer: 59 | Admitting: Neurology

## 2024-02-16 ENCOUNTER — Encounter: Payer: Self-pay | Admitting: Neurology

## 2024-02-16 VITALS — BP 201/82 | HR 52 | Ht 60.0 in | Wt 157.0 lb

## 2024-02-16 DIAGNOSIS — R42 Dizziness and giddiness: Secondary | ICD-10-CM

## 2024-02-16 DIAGNOSIS — I10 Essential (primary) hypertension: Secondary | ICD-10-CM | POA: Diagnosis not present

## 2024-02-16 DIAGNOSIS — D329 Benign neoplasm of meninges, unspecified: Secondary | ICD-10-CM | POA: Diagnosis not present

## 2024-02-16 NOTE — Patient Instructions (Signed)
 Please follow up with Dr. De Peru for blood pressure management  MRI Brain for meningioma follow up  Return as needed

## 2024-02-16 NOTE — Progress Notes (Signed)
 [facility]  PATIENT: Ashley [name] DOB: 16-Aug-1946  REQUESTING CLINICIAN: Wanda Plump, MD HISTORY FROM: Patient  REASON FOR VISIT: Dizziness    HISTORICAL  CHIEF COMPLAINT:  Chief Complaint  Patient presents with   New Patient (Initial Visit)    Rm12, interpreter present,  NP internal referral for Dizziness: orthostatic bp completed, pt stated frequent dizziness and nausea and vomiting    HISTORY OF PRESENT ILLNESS:  This is 78 year old with past medical history of hypertension, hyperlipidemia, heart disease, meningioma who is presenting with complaint of dizziness.  Patient reports a history of dizziness that she described as unsteadiness, swimmy headed but tells me that her dizziness is associated with her elevated blood pressure.  She does have difficult to control blood pressure, stated every time the pressure is elevated, she will also feel dizzy.  Denies any fall associated with the dizziness, denies any nausea and vomiting.  States that she does not sleep well at night.  She was also diagnosed with a brain tumor, likely meningioma, right frontal region.  Today her blood pressure is in 200 systolic.    OTHER MEDICAL CONDITIONS: Hypertension, Hyperlipidemia, heart disease, Meningioma    REVIEW OF SYSTEMS: Full 14 system review of systems performed and negative with exception of: As noted in the HPI  ALLERGIES: No Known Allergies  HOME MEDICATIONS: Outpatient Medications Prior to Visit  Medication Sig Dispense Refill   aspirin EC 81 MG tablet Take 1 tablet (81 mg total) by mouth daily. Swallow whole. 30 tablet 5   atorvastatin (LIPITOR) 20 MG tablet Take 1 tablet (20 mg total) by mouth daily. 90 tablet 1   carvedilol (COREG) 6.25 MG tablet Take 1 tablet (6.25 mg total) by mouth 2 (two) times daily with a meal. 180 tablet 3   chlorthalidone (HYGROTON) 25 MG tablet Take 1 tablet (25 mg total) by mouth daily. 90 tablet 1   Cholecalciferol 50 MCG  (2000 UT) TABS Take 1 tablet (2,000 Units total) by mouth daily. 30 tablet 3   diclofenac Sodium (VOLTAREN) 1 % GEL Apply 4 g topically 4 (four) times daily. Use for both knees     esomeprazole (NEXIUM) 20 MG capsule Take 1 capsule (20 mg total) by mouth at bedtime. 90 capsule 1   magnesium oxide (MAG-OX) 400 MG tablet Take 1 tablet by mouth daily.     valsartan (DIOVAN) 160 MG tablet Take 1 tablet (160 mg total) by mouth every evening. 90 tablet 3   zoledronic acid (RECLAST) 5 MG/100ML SOLN injection Inject 5 mg into the vein once.     No facility-administered medications prior to visit.    PAST MEDICAL HISTORY: Past Medical History:  Diagnosis Date   Amaurosis fugax    Carotid body tumor (HCC)    GERD (gastroesophageal reflux disease)    Hyperlipidemia    Hypertension    Meningioma (HCC)    Mood disorder (HCC)    Osteoporosis    Paraganglioma (HCC)    SVT (supraventricular tachycardia) (HCC)    TIA (transient ischemic attack)     PAST SURGICAL HISTORY: Past Surgical History:  Procedure Laterality Date   CHOLECYSTECTOMY     HERNIA REPAIR     IR TRANSCATH/EMBOLIZ     OTHER SURGICAL HISTORY  09/14/2018   carotid lesion    FAMILY HISTORY: Family History  Problem Relation Age of Onset   CAD Father 61   Colon cancer Sister    Breast cancer Sister    Head & neck  cancer Brother    CAD Brother 8       MI    SOCIAL HISTORY: Social History   Socioeconomic History   Marital status: Married    Spouse name: Not on file   Number of children: 5   Years of education: Not on file   Highest education level: Not on file  Occupational History   Not on file  Tobacco Use   Smoking status: Never    Passive exposure: Never   Smokeless tobacco: Never  Vaping Use   Vaping status: Never Used  Substance and Sexual Activity   Alcohol use: No   Drug use: No   Sexual activity: Not on file  Other Topics Concern   Not on file  Social History Narrative   Original from [place]    Moved to [place] 1999, moved  from [employer] to [place] 06-2022 to live w/ daughter and son in Social worker [name])    Married, don't have children in common , husband living in [place] as of 07-2022    2 son, 2 daughters   Lost a son   Social Drivers of Corporate investment banker Strain: Low Risk  (01/19/2024)   Overall Financial Resource Strain (CARDIA)    Difficulty of Paying Living Expenses: Not hard at all  Food Insecurity: No Food Insecurity (01/19/2024)   Hunger Vital Sign    Worried About Running Out of Food in the Last Year: Never true    Ran Out of Food in the Last Year: Never true  Transportation Needs: Unmet Transportation Needs (01/19/2024)   PRAPARE - Transportation    Lack of Transportation (Medical): Yes    Lack of Transportation (Non-Medical): Yes  Physical Activity: Insufficiently Active (10/13/2023)   Exercise Vital Sign    Days of Exercise per Week: 3 days    Minutes of Exercise per Session: 30 min  Stress: No Stress Concern Present (10/13/2023)   [employer] of Occupational Health - Occupational Stress Questionnaire    Feeling of Stress : Not at all  Social Connections: Moderately Integrated (10/13/2023)   Social Connection and Isolation Panel [NHANES]    Frequency of Communication with Friends and Family: More than three times a week    Frequency of Social Gatherings with Friends and Family: Once a week    Attends Religious Services: More than 4 times per year    Active Member of Clubs or Organizations: No    Attends Banker Meetings: Never    Marital Status: Married  Catering manager Violence: Not At Risk (10/13/2023)   Humiliation, Afraid, Rape, and Kick questionnaire    Fear of Current or Ex-Partner: No    Emotionally Abused: No    Physically Abused: No    Sexually Abused: No    PHYSICAL EXAM  GENERAL EXAM/CONSTITUTIONAL: Vitals:  Vitals:   02/16/24 1422 02/16/24 1423 02/16/24 1425  BP: (!) 193/88 (!) 212/84 (!) 201/82  Pulse: 95 (!) 53 (!) 52  SpO2:  97% 98% 98%  Weight: 157 lb (71.2 kg)    Height: 5' (1.524 m)     Body mass index is 30.66 kg/m. Wt Readings from Last 3 Encounters:  02/16/24 157 lb (71.2 kg)  01/18/24 155 lb 6.4 oz (70.5 kg)  01/11/24 156 lb 9.6 oz (71 kg)   Patient is in no distress; well developed, nourished and groomed; neck is supple  MUSCULOSKELETAL: Gait, strength, tone, movements noted in Neurologic exam below  NEUROLOGIC: MENTAL STATUS:  No data to display         awake, alert, oriented to person, place and time recent and remote memory intact normal attention and concentration language fluent, comprehension intact, naming intact fund of knowledge appropriate  CRANIAL NERVE:  2nd, 3rd, 4th, 6th - Visual fields full to confrontation, extraocular muscles intact, no nystagmus 5th - facial sensation symmetric 7th - facial strength symmetric 8th - hearing intact 9th - palate elevates symmetrically, uvula midline 11th - shoulder shrug symmetric 12th - tongue protrusion midline  MOTOR:  normal bulk and tone, full strength in the BUE, BLE  SENSORY:  normal and symmetric to light touch  COORDINATION:  finger-nose-finger, fine finger movements normal  GAIT/STATION:  normal     DIAGNOSTIC DATA (LABS, IMAGING, TESTING) - I reviewed patient records, labs, notes, testing and imaging myself where available.  Lab Results  Component Value Date   WBC 8.1 09/21/2023   HGB 13.2 09/21/2023   HCT 40.4 09/21/2023   MCV 96.9 09/21/2023   PLT 138 (L) 09/21/2023      Component Value Date/Time   NA 141 11/25/2023 1323   K 3.9 11/25/2023 1323   CL 101 11/25/2023 1323   CO2 25 11/25/2023 1323   GLUCOSE 81 11/25/2023 1323   GLUCOSE 95 09/21/2023 1719   BUN 14 11/25/2023 1323   CREATININE 0.56 (L) 11/25/2023 1323   CREATININE 0.51 (L) 08/15/2022 1401   CALCIUM 9.1 11/25/2023 1323   PROT 7.1 09/21/2023 1719   ALBUMIN 3.8 09/21/2023 1719   AST 26 09/21/2023 1719   ALT 25 09/21/2023 1719    ALKPHOS 50 09/21/2023 1719   BILITOT 1.0 09/21/2023 1719   GFRNONAA >60 09/21/2023 1719   Lab Results  Component Value Date   CHOL 146 08/15/2022   HDL 63 08/15/2022   LDLCALC 64 08/15/2022   TRIG 104 08/15/2022   CHOLHDL 2.3 08/15/2022   Lab Results  Component Value Date   HGBA1C 5.2 10/05/2023   No results found for: "VITAMINB12" Lab Results  Component Value Date   TSH 2.081 09/21/2023     ASSESSMENT AND PLAN  78 y.o. year old female with hypertension, hyperlipidemia, meningioma who is presenting with complaints of dizziness.  She tells me that dizziness is associated with elevated blood pressure.  She is currently on Coreg, Chlorthalidone, valsartan but her blood pressure still in the 200s systolic.  Plan will be for patient to follow-up with PCP and have her blood pressure medication adjusted.  For her history of meningioma, will obtain a repeat MRI brain with contrast.  I will contact you to go over the result.  Continue to follow with your PCP and return as needed.   1. Dizziness   2. Meningioma (HCC)   3. Hypertension, unspecified type      Patient Instructions  Please follow up with Dr. De Peru for blood pressure management  MRI Brain for meningioma follow up  Return as needed   Orders Placed This Encounter  Procedures   MR BRAIN W WO CONTRAST    No orders of the defined types were placed in this encounter.   Return if symptoms worsen or fail to improve.    Windell Norfolk, MD 02/16/2024, 3:16 PM  [facility] [address], [address] [place], [place] [address] [phone]

## 2024-02-17 ENCOUNTER — Telehealth: Payer: Self-pay | Admitting: Neurology

## 2024-02-17 NOTE — Telephone Encounter (Signed)
 no auth required sent to GI [phone]

## 2024-02-22 ENCOUNTER — Encounter (HOSPITAL_BASED_OUTPATIENT_CLINIC_OR_DEPARTMENT_OTHER): Payer: Self-pay | Admitting: Family Medicine

## 2024-02-22 ENCOUNTER — Ambulatory Visit (INDEPENDENT_AMBULATORY_CARE_PROVIDER_SITE_OTHER): Payer: 59 | Admitting: Family Medicine

## 2024-02-22 VITALS — BP 193/65 | HR 50 | Ht 60.0 in | Wt 156.0 lb

## 2024-02-22 DIAGNOSIS — I10 Essential (primary) hypertension: Secondary | ICD-10-CM | POA: Diagnosis not present

## 2024-02-22 DIAGNOSIS — N83209 Unspecified ovarian cyst, unspecified side: Secondary | ICD-10-CM | POA: Insufficient documentation

## 2024-02-22 DIAGNOSIS — N83202 Unspecified ovarian cyst, left side: Secondary | ICD-10-CM | POA: Insufficient documentation

## 2024-02-22 MED ORDER — VALSARTAN 160 MG PO TABS: 160.0000 mg | ORAL_TABLET | Freq: Every evening | ORAL | 0 refills | Status: DC

## 2024-02-22 MED ORDER — CARVEDILOL 6.25 MG PO TABS: 6.2500 mg | ORAL_TABLET | Freq: Two times a day (BID) | ORAL | 0 refills | Status: AC

## 2024-02-22 MED ORDER — ESOMEPRAZOLE MAGNESIUM 20 MG PO CPDR: 20.0000 mg | DELAYED_RELEASE_CAPSULE | Freq: Every day | ORAL | 1 refills | Status: AC

## 2024-02-22 NOTE — Assessment & Plan Note (Signed)
 Blood pressure elevated initially in office.  At last appointment with her cardiologist, amlodipine was discontinued due to lower extremity swelling.  She did have some other medication changes and currently continues with valsartan, carvedilol, chlorthalidone.  Denies any issues with current medications.  Consideration by cardiologist was to increase carvedilol if blood pressure continues to be elevated. We discussed considerations today.  Unfortunately, patient will be leaving later this week to travel internationally.  She will not be returning for about 2 months.  Unfortunately, given the situation it is difficult to make specific medication changes today as we will not be able to follow-up on progress with dose change, particularly to follow-up on blood pressure as well as labs if dose was adjusted for valsartan.  I do have some hesitancy regarding change to carvedilol dose given current low heart rate and concerns that increasing dose of carvedilol may precipitate further drop in her heart rate.  Discussed these concerns with the patient.  She voiced understanding and she is preferring to hold off on the change in medication due to upcoming travel. We will plan for close follow-up upon her return

## 2024-02-22 NOTE — Patient Instructions (Signed)
   Medication Instructions:  Your physician recommends that you continue on your current medications as directed. Please refer to the Current Medication list given to you today. --If you need a refill on any your medications before your next appointment, please call your pharmacy first. If no refills are authorized on file call the office.--   Follow-Up: Your next appointment:   Your physician recommends that you schedule a follow-up appointment in: 2-3 month follow up  with Dr. de Peru  You will receive a text message or e-mail with a link to a survey about your care and experience with [place] today! We would greatly appreciate your feedback!   Thanks for letting us be apart of your health journey!!  Primary Care and Sports Medicine   Dr. Ceasar Mons Peru   We encourage you to activate your patient portal called "MyChart".  Sign up information is provided on this After Visit Summary.  MyChart is used to connect with patients for Virtual Visits (Telemedicine).  Patients are able to view lab/test results, encounter notes, upcoming appointments, etc.  Non-urgent messages can be sent to your provider as well. To learn more about what you can do with MyChart, please visit --  [email or web address].

## 2024-02-22 NOTE — Assessment & Plan Note (Addendum)
 Left ovarian cyst observed on CT scan in November 2024 with recommendation for [place] in 6-12 months for monitoring. We will plan to order this at time of next appointment.

## 2024-02-22 NOTE — Progress Notes (Signed)
    Procedures performed today:    None.  Independent interpretation of notes and tests performed by another provider:   None.  Brief History, Exam, Impression, and Recommendations:    BP (!) 193/65 (BP Location: Right Arm, Patient Position: Sitting, Cuff Size: Large)   Pulse (!) 50   Ht 5' (1.524 m)   Wt 156 lb (70.8 kg)   SpO2 96%   BMI 30.47 kg/m   Hypertension, benign Assessment & Plan: Blood pressure elevated initially in office.  At last appointment with her cardiologist, amlodipine was discontinued due to lower extremity swelling.  She did have some other medication changes and currently continues with valsartan, carvedilol, chlorthalidone.  Denies any issues with current medications.  Consideration by cardiologist was to increase carvedilol if blood pressure continues to be elevated. We discussed considerations today.  Unfortunately, patient will be leaving later this week to travel internationally.  She will not be returning for about 2 months.  Unfortunately, given the situation it is difficult to make specific medication changes today as we will not be able to follow-up on progress with dose change, particularly to follow-up on blood pressure as well as labs if dose was adjusted for valsartan.  I do have some hesitancy regarding change to carvedilol dose given current low heart rate and concerns that increasing dose of carvedilol may precipitate further drop in her heart rate.  Discussed these concerns with the patient.  She voiced understanding and she is preferring to hold off on the change in medication due to upcoming travel. We will plan for close follow-up upon her return   Cyst of ovary, unspecified laterality Assessment & Plan: Left ovarian cyst observed on CT scan in November 2024 with recommendation for [place] in 6-12 months for monitoring. We will plan to order this at time of next appointment.   Other orders -     Valsartan; Take 1 tablet (160 mg total) by mouth  every evening.  Dispense: 90 tablet; Refill: 0 -     Carvedilol; Take 1 tablet (6.25 mg total) by mouth 2 (two) times daily with a meal.  Dispense: 180 tablet; Refill: 0 -     Esomeprazole Magnesium; Take 1 capsule (20 mg total) by mouth at bedtime.  Dispense: 90 capsule; Refill: 1  Return in about 3 months (around 05/23/2024) for hypertension.   ___________________________________________ Lam Mccubbins de Peru, MD, ABFM, CAQSM Primary Care and Sports Medicine [facility]

## 2024-02-29 ENCOUNTER — Ambulatory Visit: Payer: 59 | Admitting: Cardiology

## 2024-04-19 ENCOUNTER — Ambulatory Visit: Admitting: Nurse Practitioner

## 2024-05-25 NOTE — Progress Notes (Deleted)
 Cardiology Office Note    Patient Name: Ashley [name] Date of Encounter: 05/25/2024  Primary Care Provider:  de Peru, Quintin PARAS, MD Primary Cardiologist:  Madonna Large, DO Primary Electrophysiologist: None   Past Medical History    Past Medical History:  Diagnosis Date   Amaurosis fugax    Carotid body tumor (HCC)    GERD (gastroesophageal reflux disease)    Hyperlipidemia    Hypertension    Meningioma (HCC)    Mood disorder (HCC)    Osteoporosis    Paraganglioma (HCC)    SVT (supraventricular tachycardia) (HCC)    TIA (transient ischemic attack)     History of Present Illness  Ashley [name] is a 78 y.o. female with a PMH of HTN, HLD, GERD, TIA, SVT who presents today for 47-month follow-up.   Ms. Azalea was last seen on 01/18/2024 for 16-month follow-up.  During visit patient reported improvement to lower extremity swelling with reduced dose of amlodipine .  Her blood pressure was initially elevated and stable on recheck at 138/78.  She had amlodipine  discontinued and patient was advised to monitor blood pressures with plan to increase carvedilol  if elevated.  She also noted reduction in lightheadedness and was advised to stay hydrated.    Patient denies chest pain, palpitations, dyspnea, PND, orthopnea, nausea, vomiting, dizziness, syncope, edema, weight gain, or early satiety.   Discussed the use of AI scribe software for clinical note transcription with the patient, who gave verbal consent to proceed.  History of Present Illness    ***Notes:   Review of Systems  Please see the history of present illness.    All other systems reviewed and are otherwise negative except as noted above.  Physical Exam    Wt Readings from Last 3 Encounters:  02/22/24 156 lb (70.8 kg)  02/16/24 157 lb (71.2 kg)  01/18/24 155 lb 6.4 oz (70.5 kg)   CD:Uyzmz were no vitals filed for this visit.,There is no height or weight on file to calculate BMI. GEN: Well nourished, well  developed in no acute distress Neck: No JVD; No carotid bruits Pulmonary: Clear to auscultation without rales, wheezing or rhonchi  Cardiovascular: Normal rate. Regular rhythm. Normal S1. Normal S2.   Murmurs: There is no murmur.  ABDOMEN: Soft, non-tender, non-distended EXTREMITIES:  No edema; No deformity   EKG/LABS/ Recent Cardiac Studies   ECG personally reviewed by me today - ***  Risk Assessment/Calculations:   {Does this patient have ATRIAL FIBRILLATION?[phone]}  STOP-Bang Score:  4  { Consider Dx Sleep Disordered Breathing or Sleep Apnea  ICD G47.33          :1}    Lab Results  Component Value Date   WBC 8.1 09/21/2023   HGB 13.2 09/21/2023   HCT 40.4 09/21/2023   MCV 96.9 09/21/2023   PLT 138 (L) 09/21/2023   Lab Results  Component Value Date   CREATININE 0.56 (L) 11/25/2023   BUN 14 11/25/2023   NA 141 11/25/2023   K 3.9 11/25/2023   CL 101 11/25/2023   CO2 25 11/25/2023   Lab Results  Component Value Date   CHOL 146 08/15/2022   HDL 63 08/15/2022   LDLCALC 64 08/15/2022   TRIG 104 08/15/2022   CHOLHDL 2.3 08/15/2022    Lab Results  Component Value Date   HGBA1C 5.2 10/05/2023   Assessment & Plan    Assessment and Plan Assessment & Plan   1.  Primary HTN: -Patient's blood pressure today was stable at 148/84  and was 138/78 on recheck -Patient continues to have trace lower extremity edema and amlodipine  2.5 mg to be discontinued -She will monitor blood pressures for the next 2 weeks with plan to increase carvedilol  to 12.5 mg twice daily if necessary -Continue chlorthalidone  25 mg daily and carvedilol  6.25 mg twice daily.   2.  History of TIA: -s/p 12/2017 with resolution of symptoms -Today patient reported no new symptoms -Continue Crestor 20 mg daily and ASA 81 mg   3.  Precordial pain: -Patient denied any chest pain during today's visit -Patient advised to contact our office if she has any further discomfort or notes any new intensity of  pain.   4.  Lightheadedness: -Today patient reports reduction in lightheadedness but still reports occasional episodes of the been occurring for some time. -She was encouraged to stand slowly and to take time with changing positions. -She was also encouraged to stay hydrated daily with at least 64 ounces of water.      Disposition: Follow-up with Sunit Tolia, DO or APP in *** months {Are you ordering a CV Procedure (e.g. stress test, cath, DCCV, TEE, etc)?   Press F2        :[id number]}   Signed, Wyn Raddle, Jackee Shove, NP 05/25/2024, 1:21 PM [facility]

## 2024-05-26 ENCOUNTER — Ambulatory Visit: Attending: Nurse Practitioner | Admitting: Nurse Practitioner

## 2024-05-26 DIAGNOSIS — R42 Dizziness and giddiness: Secondary | ICD-10-CM

## 2024-05-26 DIAGNOSIS — G459 Transient cerebral ischemic attack, unspecified: Secondary | ICD-10-CM

## 2024-05-26 DIAGNOSIS — E7800 Pure hypercholesterolemia, unspecified: Secondary | ICD-10-CM

## 2024-05-26 DIAGNOSIS — I10 Essential (primary) hypertension: Secondary | ICD-10-CM

## 2024-05-30 ENCOUNTER — Other Ambulatory Visit

## 2024-06-06 ENCOUNTER — Ambulatory Visit (HOSPITAL_BASED_OUTPATIENT_CLINIC_OR_DEPARTMENT_OTHER): Admitting: Family Medicine

## 2024-06-14 ENCOUNTER — Ambulatory Visit (HOSPITAL_BASED_OUTPATIENT_CLINIC_OR_DEPARTMENT_OTHER): Admitting: Family Medicine

## 2024-07-01 ENCOUNTER — Other Ambulatory Visit

## 2024-07-07 ENCOUNTER — Ambulatory Visit (INDEPENDENT_AMBULATORY_CARE_PROVIDER_SITE_OTHER): Admitting: Family Medicine

## 2024-07-07 ENCOUNTER — Encounter (HOSPITAL_BASED_OUTPATIENT_CLINIC_OR_DEPARTMENT_OTHER): Payer: Self-pay | Admitting: Family Medicine

## 2024-07-07 ENCOUNTER — Ambulatory Visit (HOSPITAL_BASED_OUTPATIENT_CLINIC_OR_DEPARTMENT_OTHER): Admitting: Family Medicine

## 2024-07-07 VITALS — BP 134/71 | HR 56 | Ht 59.06 in | Wt 152.4 lb

## 2024-07-07 DIAGNOSIS — K219 Gastro-esophageal reflux disease without esophagitis: Secondary | ICD-10-CM | POA: Diagnosis not present

## 2024-07-07 DIAGNOSIS — I10 Essential (primary) hypertension: Secondary | ICD-10-CM | POA: Diagnosis not present

## 2024-07-07 DIAGNOSIS — R8290 Unspecified abnormal findings in urine: Secondary | ICD-10-CM | POA: Insufficient documentation

## 2024-07-07 DIAGNOSIS — R42 Dizziness and giddiness: Secondary | ICD-10-CM

## 2024-07-07 DIAGNOSIS — R1084 Generalized abdominal pain: Secondary | ICD-10-CM | POA: Diagnosis not present

## 2024-07-07 NOTE — Progress Notes (Signed)
 Procedures performed today:    None.  Independent interpretation of notes and tests performed by another provider:   None.  Brief History, Exam, Impression, and Recommendations:    BP (!) 166/63 (BP Location: Right Arm, Patient Position: Sitting, Cuff Size: Normal)   Pulse (!) 56   Ht 4' 11.06 (1.5 m)   Wt 152 lb 6.4 oz (69.1 kg)   SpO2 95%   BMI 30.72 kg/m   In-person interpreter utilized for encounter.  Patient reports numerous issues today including intermittent abdominal pain, recently had some issues with central and left-sided abdominal pain.  Symptoms are improved today as compared to recent days.  She has also been having issues with elevated blood pressure readings at home.  Continues with medications as prescribed through cardiology.  Unfortunately, she was recommended to have follow-up visit in 3 months after cardiology appointment in March.  She did miss appointment that was scheduled with cardiology previously, does not have appointment scheduled at this time. She has also had evaluation with neurology regarding ongoing dizziness.  She thinks maybe this was worsened by the carvedilol  which was being prescribed by cardiology.  She was scheduled to have MRI completed.  Reports that she tried to have this done in [place], but her blood pressure was elevated and they would not do the MRI. Patient describes ambulatory BP monitor being considered in [place], but this was not placed due to color changes of arm. It appears that MRI was approved to be done here and patient may have had appointment last week at [facility] imaging.  Patient and her daughter indicate that they did go to imaging facility but the office was closed.  Reviewed with patient, within the chart indicates that patient was a no-show for imaging appointment.  Reviewed hours of imaging facility and this indicates that imaging facility should have been open for MRI from 6:30 AM to 7 PM.  Patient reports being  there around noon and office was closed, indicating that they were only open from 8-12.  Patient's daughter indicates that they went to appropriate address, however there apparently was a patient and came over who came by our office here this past Friday when we had already closed.  She also mentions issues with foamy/cloudy urine.  Denies any dysuria or lower abdominal/pelvic pain.  Gastroesophageal reflux disease, unspecified whether esophagitis present Assessment & Plan: We discussed considerations today.  Would recommend taking current PPI at least 30 to 60 minutes before food, she currently has been taking with food.  This may provide for better effectiveness of medication.  Additional consideration is for increasing dose of medication from 20 mg to 40 mg.  She would prefer to hold off on this for now.  She previously has follow-up with gastroenterologist, however has not established with one locally.  She would like to reestablish with GI, referral placed today  Orders: -     Ambulatory referral to Gastroenterology  Hypertension, benign Assessment & Plan: Blood pressure is elevated today, improved compared to prior visit with me, although elevated from prior visit with cardiology.  She did see cardiology in March with recommendation for 68-month follow-up, however has not had follow-up as of yet. We discussed considerations related to medication management.  She does want to have blood pressure better control, however in discussing medication changes such as increasing dose of valsartan  or adding additional agent, patient is reluctant to make these medication changes.  Discussed that ultimately blood pressure may remain above goal without  appropriate changes.  She indicates that she would prefer to continue with current medications, monitor blood pressure at home, focus on lifestyle modifications. We did provide patient with contact information for cardiology office so that she can call to  reschedule appointment with them as well Amlodipine  previously stopped due to lower extremity swelling  Orders: -     CBC with Differential/Platelet -     Comprehensive metabolic panel with GFR -     Hemoglobin A1c -     Urinalysis, Complete -     Microalbumin / creatinine urine ratio  Dizziness Assessment & Plan: Has had prior evaluation with neurology and was recommended to have MRI.  She has not had MRI completed as of yet.  It appears that she may have missed appointment for MRI recently.  Provided patient with contact information for imaging facility for her to reach out and reschedule imaging   Generalized abdominal pain -     Ambulatory referral to Gastroenterology  Cloudy urine Assessment & Plan: We can proceed with further testing today as below.  Low concern for infection at this time  Orders: -     Urinalysis, Complete -     Microalbumin / creatinine urine ratio  Return in about 3 months (around 10/07/2024).  Spent 48 minutes on this patient encounter, including preparation, chart review, face-to-face counseling with patient and coordination of care, and documentation of encounter   ___________________________________________ Ladonna Vanorder de Peru, MD, ABFM, [facility]

## 2024-07-07 NOTE — Assessment & Plan Note (Addendum)
 Blood pressure is elevated today, improved compared to prior visit with me, although elevated from prior visit with cardiology.  She did see cardiology in March with recommendation for 74-month follow-up, however has not had follow-up as of yet. We discussed considerations related to medication management.  She does want to have blood pressure better control, however in discussing medication changes such as increasing dose of valsartan  or adding additional agent, patient is reluctant to make these medication changes.  Discussed that ultimately blood pressure may remain above goal without appropriate changes.  She indicates that she would prefer to continue with current medications, monitor blood pressure at home, focus on lifestyle modifications. We did provide patient with contact information for cardiology office so that she can call to reschedule appointment with them as well Amlodipine  previously stopped due to lower extremity swelling

## 2024-07-07 NOTE — Patient Instructions (Addendum)
  Medication Instructions:  Your physician recommends that you continue on your current medications as directed. Please refer to the Current Medication list given to you today. --If you need a refill on any your medications before your next appointment, please call your pharmacy first. If no refills are authorized on file call the office.-- Lab Work: Your physician has recommended that you have lab work today: today If you have labs (blood work) drawn today and your tests are completely normal, you will receive your results via MyChart message OR a phone call from our staff.  Please ensure you check your voicemail in the event that you authorized detailed messages to be left on a delegated number. If you have any lab test that is abnormal or we need to change your treatment, we will call you to review the results.  Referrals/Procedures/Imaging: [facility] [phone] Cardiology [phone]  Follow-Up: Your next appointment:   Your physician recommends that you schedule a follow-up appointment in: 3 months follow up  with Dr. de Peru  You will receive a text message or e-mail with a link to a survey about your care and experience with us  today! We would greatly appreciate your feedback!   Thanks for letting us  be apart of your health journey!!  Primary Care and Sports Medicine   Dr. Quintin sheerer Peru   We encourage you to activate your patient portal called MyChart.  Sign up information is provided on this After Visit Summary.  MyChart is used to connect with patients for Virtual Visits (Telemedicine).  Patients are able to view lab/test results, encounter notes, upcoming appointments, etc.  Non-urgent messages can be sent to your provider as well. To learn more about what you can do with MyChart, please visit --  [email or web address].

## 2024-07-07 NOTE — Assessment & Plan Note (Signed)
 Has had prior evaluation with neurology and was recommended to have MRI.  She has not had MRI completed as of yet.  It appears that she may have missed appointment for MRI recently.  Provided patient with contact information for imaging facility for her to reach out and reschedule imaging

## 2024-07-07 NOTE — Assessment & Plan Note (Signed)
 We discussed considerations today.  Would recommend taking current PPI at least 30 to 60 minutes before food, she currently has been taking with food.  This may provide for better effectiveness of medication.  Additional consideration is for increasing dose of medication from 20 mg to 40 mg.  She would prefer to hold off on this for now.  She previously has follow-up with gastroenterologist, however has not established with one locally.  She would like to reestablish with GI, referral placed today

## 2024-07-07 NOTE — Assessment & Plan Note (Signed)
 We can proceed with further testing today as below.  Low concern for infection at this time

## 2024-07-10 LAB — CBC WITH DIFFERENTIAL/PLATELET
Basophils Absolute: 0 x10E3/uL (ref 0.0–0.2)
Basos: 0 %
EOS (ABSOLUTE): 0.2 x10E3/uL (ref 0.0–0.4)
Eos: 2 %
Hematocrit: 39.4 % (ref 34.0–46.6)
Hemoglobin: 13 g/dL (ref 11.1–15.9)
Immature Grans (Abs): 0 x10E3/uL (ref 0.0–0.1)
Immature Granulocytes: 0 %
Lymphocytes Absolute: 2.5 x10E3/uL (ref 0.7–3.1)
Lymphs: 31 %
MCH: 32.7 pg (ref 26.6–33.0)
MCHC: 33 g/dL (ref 31.5–35.7)
MCV: 99 fL — ABNORMAL HIGH (ref 79–97)
Monocytes Absolute: 0.7 x10E3/uL (ref 0.1–0.9)
Monocytes: 8 %
Neutrophils Absolute: 4.6 x10E3/uL (ref 1.4–7.0)
Neutrophils: 59 %
Platelets: 170 x10E3/uL (ref 150–450)
RBC: 3.97 x10E6/uL (ref 3.77–5.28)
RDW: 13.2 % (ref 11.7–15.4)
WBC: 8 x10E3/uL (ref 3.4–10.8)

## 2024-07-10 LAB — MICROALBUMIN / CREATININE URINE RATIO
Creatinine, Urine: 85.3 mg/dL
Microalb/Creat Ratio: 13 mg/g{creat} (ref 0–29)
Microalbumin, Urine: 11.1 ug/mL

## 2024-07-10 LAB — COMPREHENSIVE METABOLIC PANEL WITH GFR
ALT: 22 IU/L (ref 0–32)
AST: 19 IU/L (ref 0–40)
Albumin: 4.5 g/dL (ref 3.8–4.8)
Alkaline Phosphatase: 84 IU/L (ref 44–121)
BUN/Creatinine Ratio: 30 — ABNORMAL HIGH (ref 12–28)
BUN: 17 mg/dL (ref 8–27)
Bilirubin Total: 1.2 mg/dL (ref 0.0–1.2)
CO2: 24 mmol/L (ref 20–29)
Calcium: 9.6 mg/dL (ref 8.7–10.3)
Chloride: 96 mmol/L (ref 96–106)
Creatinine, Ser: 0.57 mg/dL (ref 0.57–1.00)
Globulin, Total: 2.9 g/dL (ref 1.5–4.5)
Glucose: 116 mg/dL — ABNORMAL HIGH (ref 70–99)
Potassium: 4.5 mmol/L (ref 3.5–5.2)
Sodium: 135 mmol/L (ref 134–144)
Total Protein: 7.4 g/dL (ref 6.0–8.5)
eGFR: 92 mL/min/1.73 (ref >59)

## 2024-07-10 LAB — HEMOGLOBIN A1C
Est. average glucose Bld gHb Est-mCnc: 114 mg/dL
Hgb A1c MFr Bld: 5.6 % (ref 4.8–5.6)

## 2024-07-10 LAB — URINALYSIS, COMPLETE
Bilirubin, UA: NEGATIVE
Glucose, UA: NEGATIVE
Ketones, UA: NEGATIVE
Nitrite, UA: NEGATIVE
Protein,UA: NEGATIVE
RBC, UA: NEGATIVE
Specific Gravity, UA: 1.019 (ref 1.005–1.030)
Urobilinogen, Ur: 0.2 mg/dL (ref 0.2–1.0)
pH, UA: 6.5 (ref 5.0–7.5)

## 2024-07-10 LAB — MICROSCOPIC EXAMINATION
Bacteria, UA: NONE SEEN
Casts: NONE SEEN /LPF
Epithelial Cells (non renal): NONE SEEN /HPF (ref 0–10)

## 2024-07-13 ENCOUNTER — Ambulatory Visit: Attending: Cardiology | Admitting: Cardiology

## 2024-07-13 ENCOUNTER — Ambulatory Visit (HOSPITAL_BASED_OUTPATIENT_CLINIC_OR_DEPARTMENT_OTHER): Payer: Self-pay | Admitting: Family Medicine

## 2024-07-13 ENCOUNTER — Encounter: Payer: Self-pay | Admitting: Cardiology

## 2024-07-13 VITALS — BP 160/84 | HR 58 | Resp 16 | Ht 59.0 in | Wt 156.0 lb

## 2024-07-13 DIAGNOSIS — R931 Abnormal findings on diagnostic imaging of heart and coronary circulation: Secondary | ICD-10-CM

## 2024-07-13 DIAGNOSIS — I10 Essential (primary) hypertension: Secondary | ICD-10-CM | POA: Diagnosis not present

## 2024-07-13 DIAGNOSIS — E7800 Pure hypercholesterolemia, unspecified: Secondary | ICD-10-CM

## 2024-07-13 DIAGNOSIS — G459 Transient cerebral ischemic attack, unspecified: Secondary | ICD-10-CM | POA: Diagnosis not present

## 2024-07-13 DIAGNOSIS — R072 Precordial pain: Secondary | ICD-10-CM | POA: Diagnosis not present

## 2024-07-13 MED ORDER — AMLODIPINE BESYLATE 5 MG PO TABS: 5.0000 mg | ORAL_TABLET | Freq: Every morning | ORAL | 3 refills | Status: DC

## 2024-07-13 MED ORDER — VALSARTAN 320 MG PO TABS: 320.0000 mg | ORAL_TABLET | Freq: Every evening | ORAL | 3 refills | Status: AC

## 2024-07-13 NOTE — Progress Notes (Signed)
 Cardiology Office Note:    Date:  07/13/2024  NAME:  Ashley [name]    MRN: [id number] DOB:  Jun 30, 1946   PCP:  de Peru, Raymond J, MD  Former Cardiology Providers: N/A Primary Cardiologist:  Madonna Large, DO, [facility] (established care 11/19/2023) Electrophysiologist:  None   Chief Complaint  Patient presents with   Hypertension   Follow-up    Chest pain    History of Present Illness:    Ashley [name] is a 78 y.o.  female whose past medical history and cardiovascular risk factors includes: Moderate coronary artery calcification, hypertension, HLD, gastritis, osteopenia, TIA.   She is accompanied by a Spanish interpreter during today's encounter as well as her daughter.  Patient was referred to the practice for evaluation of dizziness and labile hypertension.  At last office visit patient's medications were increased.  Valsartan  was increased 160 mg p.o. every afternoon and amlodipine  was increased to 10 mg p.o. every morning.  Renal duplex was recommended and performed which noted no obstructive renal artery stenosis.  With regards to her precordial pain echocardiogram noted preserved LVEF and no significant valvular heart disease and coronary calcification was reported to be moderate.  She was referred back to us  by PCP for further evaluation and management.  Review of her current medication illustrates that she is not on amlodipine  10 mg p.o. daily.  Her blood pressures are consistently elevated greater than 160 mmHg.  She takes chlorthalidone  in the morning, valsartan  at night, and carvedilol  as twice daily.  Chest pain: Occurs twice a week. Duration less than a minute. Left-sided. Intensity 7 out of 10. Nonradiating. Worse with effort related activities. Improves with resting and relaxing. Currently asymptomatic. More frequent compared to January 2025. Patient states that she has not seek medical attention sooner as she was in [place] taking care of her daughter who has  cancer.  Current Medications: Current Meds  Medication Sig   amLODipine  (NORVASC ) 5 MG tablet Take 1 tablet (5 mg total) by mouth in the morning.   aspirin  EC 81 MG tablet Take 1 tablet (81 mg total) by mouth daily. Swallow whole.   atorvastatin  (LIPITOR) 20 MG tablet Take 1 tablet (20 mg total) by mouth daily.   carvedilol  (COREG ) 6.25 MG tablet Take 1 tablet (6.25 mg total) by mouth 2 (two) times daily with a meal.   chlorthalidone  (HYGROTON ) 25 MG tablet Take 1 tablet (25 mg total) by mouth daily.   Cholecalciferol  50 MCG (2000 UT) TABS Take 1 tablet (2,000 Units total) by mouth daily.   diclofenac Sodium (VOLTAREN) 1 % GEL Apply 4 g topically 4 (four) times daily. Use for both knees   esomeprazole  (NEXIUM ) 20 MG capsule Take 1 capsule (20 mg total) by mouth at bedtime.   zoledronic acid (RECLAST) 5 MG/100ML SOLN injection Inject 5 mg into the vein once.   [DISCONTINUED] valsartan  (DIOVAN ) 160 MG tablet Take 1 tablet (160 mg total) by mouth every evening.     Allergies:    Patient has no known allergies.   Past Medical History: Past Medical History:  Diagnosis Date   Amaurosis fugax    Carotid body tumor (HCC)    GERD (gastroesophageal reflux disease)    Hyperlipidemia    Hypertension    Meningioma (HCC)    Mood disorder (HCC)    Osteoporosis    Paraganglioma (HCC)    SVT (supraventricular tachycardia) (HCC)    TIA (transient ischemic attack)     Past Surgical History: Past Surgical  History:  Procedure Laterality Date   CHOLECYSTECTOMY     HERNIA REPAIR     IR TRANSCATH/EMBOLIZ     OTHER SURGICAL HISTORY  09/14/2018   carotid lesion    Social History: Social History   Tobacco Use   Smoking status: Never    Passive exposure: Never   Smokeless tobacco: Never  Vaping Use   Vaping status: Never Used  Substance Use Topics   Alcohol use: No   Drug use: No    Family History: Family History  Problem Relation Age of Onset   CAD Father 11   Colon cancer Sister     Breast cancer Sister    Head & neck cancer Brother    CAD Brother 43       MI    ROS:   Review of Systems  Cardiovascular:  Positive for chest pain and dyspnea on exertion. Negative for claudication, irregular heartbeat, leg swelling, near-syncope, orthopnea, palpitations, paroxysmal nocturnal dyspnea and syncope.  Respiratory:  Positive for snoring. Negative for shortness of breath.   Hematologic/Lymphatic: Negative for bleeding problem.  Neurological:  Positive for headaches and light-headedness.    EKGs/Labs/Other Studies Reviewed:   EKG: EKG Interpretation Date/Time:  Wednesday July 13 2024 11:40:45 EDT Ventricular Rate:  54 PR Interval:  174 QRS Duration:  108 QT Interval:  442 QTC Calculation: 419 R Axis:   -54  Text Interpretation: Sinus bradycardia Left axis deviation Pulmonary disease pattern Incomplete right bundle branch block Minimal voltage criteria for LVH, may be normal variant ( Cornell product ) When compared with ECG of 19-Nov-2023 14:53, No significant change was found Confirmed by Michele Richardson [phone]) on 07/13/2024 11:51:23 AM  Echocardiogram: 12/18/2023 1. Left ventricular ejection fraction, by estimation, is 60 to 65%. The left ventricle has normal function. The left ventricle has no regional wall motion abnormalities. There is mild concentric left ventricular hypertrophy. Left ventricular diastolic  parameters are consistent with Grade II diastolic dysfunction (pseudonormalization). The average left ventricular global longitudinal strain is -23.2 %. The global longitudinal strain is normal. 2. Right ventricular systolic function is normal. The right ventricular size is normal. A Prominent moderator band in RV apex is visualized. There is normal pulmonary artery systolic pressure. The estimated right ventricular systolic pressure is 6.4  mmHg. 3. Left atrial size was mildly dilated. 4. The mitral valve is degenerative. No evidence of mitral valve  regurgitation. No evidence of mitral stenosis. 5. The aortic valve was not well visualized. Aortic valve regurgitation is not visualized. No aortic stenosis is present. 6. The inferior vena cava is normal in size with greater than 50% respiratory variability, suggesting right atrial pressure of 3 mmHg.   Renal duplex: December 18, 2023 Largest Aortic Diameter: 2.0 cm Renal: Right: Normal size right kidney. Abnormal right Resistive Index. Normal cortical thickness of right kidney. 1-59% stenosis of the right renal artery. RRV flow present. Left: Normal size of left kidney. Abnormal left Resisitve Index. Normal cortical thickness of the left kidney. 1-59% stenosis of the left renal artery. LRV flow present. Mesenteric: Normal Celiac artery and Superior Mesenteric artery findings. IVC is patent.   Coronary calcium  report 12/02/2023 Coronary calcium  score of 116. Unclear race precludes accurate percentile analysis.   Labs:    Latest Ref Rng & Units 09/21/2023    5:19 PM 12/21/2017   12:28 PM 12/21/2017   12:11 PM  CBC  WBC 4.0 - 10.5 K/uL 8.1   5.4   Hemoglobin 12.0 - 15.0 g/dL 13.2  12.9  12.7   Hematocrit 36.0 - 46.0 % 40.4  38.0  38.8   Platelets 150 - 400 K/uL 138   166        Latest Ref Rng & Units 11/25/2023    1:23 PM 09/21/2023    5:19 PM 08/15/2022    2:01 PM  BMP  Glucose 70 - 99 mg/dL 81  95  893   BUN 8 - 27 mg/dL 14  14  16    Creatinine 0.57 - 1.00 mg/dL 9.43  9.47  9.48   BUN/Creat Ratio 12 - 28 25   31    Sodium 134 - 144 mmol/L 141  138  135   Potassium 3.5 - 5.2 mmol/L 3.9  4.4  3.7   Chloride 96 - 106 mmol/L 101  101  94   CO2 20 - 29 mmol/L 25  26  31    Calcium  8.7 - 10.3 mg/dL 9.1  9.3  9.9       Latest Ref Rng & Units 11/25/2023    1:23 PM 09/21/2023    5:19 PM 08/15/2022    2:01 PM  CMP  Glucose 70 - 99 mg/dL 81  95  893   BUN 8 - 27 mg/dL 14  14  16    Creatinine 0.57 - 1.00 mg/dL 9.43  9.47  9.48   Sodium 134 - 144 mmol/L 141  138  135   Potassium 3.5 - 5.2  mmol/L 3.9  4.4  3.7   Chloride 96 - 106 mmol/L 101  101  94   CO2 20 - 29 mmol/L 25  26  31    Calcium  8.7 - 10.3 mg/dL 9.1  9.3  9.9   Total Protein 6.5 - 8.1 g/dL  7.1    Total Bilirubin <1.2 mg/dL  1.0    Alkaline Phos 38 - 126 U/L  50    AST 15 - 41 U/L  26    ALT 0 - 44 U/L  25      Lab Results  Component Value Date   CHOL 146 08/15/2022   HDL 63 08/15/2022   LDLCALC 64 08/15/2022   TRIG 104 08/15/2022   CHOLHDL 2.3 08/15/2022   No results for input(s): LIPOA in the last 8760 hours. No components found for: NTPROBNP No results for input(s): PROBNP in the last 8760 hours. Recent Labs    09/21/23 1720  TSH 2.081    Physical Exam:    Today's Vitals   07/13/24 1133  BP: (!) 160/84  Pulse: (!) 58  Resp: 16  SpO2: 96%  Weight: 156 lb (70.8 kg)  Height: 4' 11 (1.499 m)   Body mass index is 31.51 kg/m. Wt Readings from Last 3 Encounters:  07/13/24 156 lb (70.8 kg)  07/07/24 152 lb 6.4 oz (69.1 kg)  02/22/24 156 lb (70.8 kg)    Orthostatic VS for the past 72 hrs (Last 3 readings):  Orthostatic BP Patient Position BP Location Cuff Size Orthostatic Pulse  07/13/24 1145 187/76 Standing Right Arm Normal 54  07/13/24 1144 (!) 201/79 Sitting Right Arm Normal 59  07/13/24 1142 188/68 Supine Right Arm Normal 56    Physical Exam  Constitutional: No distress.  hemodynamically stable  Neck: No JVD present.  Cardiovascular: Normal rate, regular rhythm, S1 normal and S2 normal. Exam reveals no gallop, no S3 and no S4.  No murmur heard. Pulmonary/Chest: Effort normal and breath sounds normal. No stridor. She has no wheezes. She has no rales.  Abdominal:  Soft. Bowel sounds are normal. She exhibits no distension. There is no abdominal tenderness.  Musculoskeletal:        General: No edema.     Cervical back: Neck supple.  Neurological: She is alert and oriented to person, place, and time. She has intact cranial nerves (2-12).  Skin: Skin is warm.     Impression  & Recommendation(s):  Impression:   ICD-10-CM   1. Precordial pain  R07.2 CT CORONARY MORPH W/CTA COR W/SCORE W/CA W/CM &/OR WO/CM    2. Agatston coronary artery calcium  score between 100 and 400  R93.1     3. Hypertension, benign  I10 EKG 12-Lead    valsartan  (DIOVAN ) 320 MG tablet    amLODipine  (NORVASC ) 5 MG tablet    Basic metabolic panel with GFR    Basic metabolic panel with GFR    4. Pure hypercholesterolemia  E78.00     5. TIA (transient ischemic attack)  G45.9        Recommendation(s):  Precordial pain Agatston coronary artery calcium  score between 100 and 400 Precordial discomfort concerning for cardiac etiology No active chest pain EKG nonischemic Echocardiogram: Preserved LVEF, no significant valvular heart disease Continue aspirin  81 mg p.o. daily. Continue lipid-lowering agents. Coronary CTA to evaluate for obstructive disease Educated her on seeking medical attention sooner by going to the closest ER via EMS if the symptoms increase in intensity, frequency, duration, or has typical chest pain as discussed in the office.  Patient verbalized understanding.   Hypertension, benign Office blood pressures are not at goal. Home blood pressures are also not at goal. Increase valsartan  to 320 mg p.o. every afternoon. Start amlodipine  5 mg p.o. every morning BMP in one week to check renal function and electrolytes Continue chlorthalidone  in the morning, carvedilol  twice daily Reemphasized importance of low-salt diet. If blood pressures are not well-controlled likely will need to refer to Pharm.D. for closer monitoring and evaluation and uptitration of medical therapy  Pure hypercholesterolemia Continue Lipitor 20 mg p.o. nightly  TIA (transient ischemic attack) Continue aspirin  and statin therapy.   Reemphasized importance of blood pressure management.   Secondary prevention.  Total time spent: 45 minutes Complexity: High -discussing plan of care with regards to  precordial pain, ordering diagnostic workup and educating on when to go to the ER if symptoms were to worsen. Interdisciplinary: Yes, daughter present Independently reviewed: Echocardiogram January 2025, renal duplex January 2025, coronary calcium  score January 2025 Prescription drug management: See above Interpretation services provided by professional interpreter   Orders Placed:  Orders Placed This Encounter  Procedures   CT CORONARY MORPH W/CTA COR W/SCORE W/CA W/CM &/OR WO/CM    Standing Status:   Future    Expiration Date:   07/13/2025    If indicated for the ordered procedure, I authorize the administration of contrast media per Radiology protocol:   Yes    Initiate Coronary CTA Adult Protocol:   Yes    If indicated initiate Post Coronary CTA Hypotension Adult Protocol:   Yes    Does the patient have a contrast media/X-ray dye allergy?:   No    [facility]    Authorization::   FFR will be ordered if deemed medically necessary   Basic metabolic panel with GFR    Standing Status:   Future    Number of Occurrences:   1    Expected Date:   07/20/2024    Expiration Date:   07/13/2025  EKG 12-Lead    Final Medication List:    Meds ordered this encounter  Medications   valsartan  (DIOVAN ) 320 MG tablet    Sig: Take 1 tablet (320 mg total) by mouth every evening.    Dispense:  90 tablet    Refill:  3   amLODipine  (NORVASC ) 5 MG tablet    Sig: Take 1 tablet (5 mg total) by mouth in the morning.    Dispense:  90 tablet    Refill:  3    Medications Discontinued During This Encounter  Medication Reason   valsartan  (DIOVAN ) 160 MG tablet Reorder      Current Outpatient Medications:    amLODipine  (NORVASC ) 5 MG tablet, Take 1 tablet (5 mg total) by mouth in the morning., Disp: 90 tablet, Rfl: 3   aspirin  EC 81 MG tablet, Take 1 tablet (81 mg total) by mouth daily. Swallow whole., Disp: 30 tablet, Rfl: 5   atorvastatin  (LIPITOR) 20 MG  tablet, Take 1 tablet (20 mg total) by mouth daily., Disp: 90 tablet, Rfl: 1   carvedilol  (COREG ) 6.25 MG tablet, Take 1 tablet (6.25 mg total) by mouth 2 (two) times daily with a meal., Disp: 180 tablet, Rfl: 0   chlorthalidone  (HYGROTON ) 25 MG tablet, Take 1 tablet (25 mg total) by mouth daily., Disp: 90 tablet, Rfl: 1   Cholecalciferol  50 MCG (2000 UT) TABS, Take 1 tablet (2,000 Units total) by mouth daily., Disp: 30 tablet, Rfl: 3   diclofenac Sodium (VOLTAREN) 1 % GEL, Apply 4 g topically 4 (four) times daily. Use for both knees, Disp: , Rfl:    esomeprazole  (NEXIUM ) 20 MG capsule, Take 1 capsule (20 mg total) by mouth at bedtime., Disp: 90 capsule, Rfl: 1   zoledronic acid (RECLAST) 5 MG/100ML SOLN injection, Inject 5 mg into the vein once., Disp: , Rfl:    valsartan  (DIOVAN ) 320 MG tablet, Take 1 tablet (320 mg total) by mouth every evening., Disp: 90 tablet, Rfl: 3  Consent:   N/A  Disposition:   1 year follow-up sooner if needed   Her questions and concerns were addressed to her satisfaction. She voices understanding of the recommendations provided during this encounter.    Signed, Madonna Large, DO, [facility]  [facility]  [address] [place], [place] [address] 07/13/2024 12:49 PM

## 2024-07-13 NOTE — Patient Instructions (Addendum)
 Medication Instructions:  INCREASE Valsartan  to 320 mg at night  START Amlodipine  5 mg in the morning  *If you need a refill on your cardiac medications before your next appointment, please call your pharmacy*  Lab Work: BMP IN ONE WEEK If you have labs (blood work) drawn today and your tests are completely normal, you will receive your results only by: MyChart Message (if you have MyChart) OR A paper copy in the mail If you have any lab test that is abnormal or we need to change your treatment, we will call you to review the results.  Testing/Procedures: Coronary CTA Your physician has requested that you have cardiac CT. Cardiac computed tomography (CT) is a painless test that uses an x-ray machine to take clear, detailed pictures of your heart. For further information please visit [email or web address] Please follow instruction sheet as given.    Follow-Up: At [facility], you and your health needs are our priority.  As part of our continuing mission to provide you with exceptional heart care, our providers are all part of one team.  This team includes your primary Cardiologist (physician) and Advanced Practice Providers or APPs (Physician Assistants and Nurse Practitioners) who all work together to provide you with the care you need, when you need it.  Your next appointment:   1 Year  Provider:   Madonna Large, DO    We recommend signing up for the patient portal called MyChart.  Sign up information is provided on this After Visit Summary.  MyChart is used to connect with patients for Virtual Visits (Telemedicine).  Patients are able to view lab/test results, encounter notes, upcoming appointments, etc.  Non-urgent messages can be sent to your provider as well.   To learn more about what you can do with MyChart, go to [email or web address].   Other Instructions   Your cardiac CT will be scheduled at one of the below locations:     [facility] [address]  [place], [place] [address] [phone]   If scheduled at the Heart and Vascular Tower at [facility] street, please enter the parking lot using the [address] entrance and use the FREE valet service at the patient drop-off area. Enter the building and check-in with registration on the main floor.   Please follow these instructions carefully (unless otherwise directed):  An IV will be required for this test and Nitroglycerin will be given.  Hold all erectile dysfunction medications at least 3 days (72 hrs) prior to test. (Ie viagra, cialis, sildenafil, tadalafil, etc)   On the Night Before the Test: Be sure to Drink plenty of water. Do not consume any caffeinated/decaffeinated beverages or chocolate 12 hours prior to your test. Do not take any antihistamines 12 hours prior to your test.  On the Day of the Test: Drink plenty of water until 1 hour prior to the test. Do not eat any food 1 hour prior to test. You may take your regular medications prior to the test.  If you take Furosemide/Hydrochlorothiazide/Spironolactone/Chlorthalidone , please HOLD on the morning of the test. Patients who wear a continuous glucose monitor MUST remove the device prior to scanning. FEMALES- please wear underwire-free bra if available, avoid dresses & tight clothing      After the Test: Drink plenty of water. After receiving IV contrast, you may experience a mild flushed feeling. This is normal. On occasion, you may experience a mild rash up to 24 hours after the test. This is not  dangerous. If this occurs, you can take Benadryl 25 mg, Zyrtec, Claritin, or Allegra and increase your fluid intake. (Patients taking Tikosyn should avoid Benadryl, and may take Zyrtec, Claritin, or Allegra) If you experience trouble breathing, this can be serious. If it is severe call 911 IMMEDIATELY. If it is mild, please call our office.  We will call to schedule your test 2-4 weeks out understanding  that some insurance companies will need an authorization prior to the service being performed.   For more information and frequently asked questions, please visit our website : [email or web address]  For non-scheduling related questions, please contact the cardiac imaging nurse navigator should you have any questions/concerns: Cardiac Imaging Nurse Navigators Direct Office Dial: [phone]   For scheduling needs, including cancellations and rescheduling, please call [place], [phone].

## 2024-07-19 ENCOUNTER — Encounter (HOSPITAL_COMMUNITY): Payer: Self-pay

## 2024-07-20 ENCOUNTER — Telehealth (HOSPITAL_COMMUNITY): Payer: Self-pay | Admitting: Emergency Medicine

## 2024-07-20 DIAGNOSIS — I10 Essential (primary) hypertension: Secondary | ICD-10-CM | POA: Diagnosis not present

## 2024-07-20 NOTE — Telephone Encounter (Signed)
 Reaching out to patient to offer assistance regarding upcoming cardiac imaging study; pt verbalizes understanding of appt date/time, parking situation and where to check in, pre-test NPO status and medications ordered, and verified current allergies; name and call back number provided for further questions should they arise Rockwell Alexandria RN Navigator Cardiac Imaging [facility] [phone] office [phone] cell

## 2024-07-21 ENCOUNTER — Ambulatory Visit (HOSPITAL_COMMUNITY)
Admission: RE | Admit: 2024-07-21 | Discharge: 2024-07-21 | Disposition: A | Discharge status: Home / Self Care | Source: Ambulatory Visit | Attending: Cardiology | Admitting: Cardiology

## 2024-07-21 DIAGNOSIS — R072 Precordial pain: Secondary | ICD-10-CM | POA: Diagnosis not present

## 2024-07-21 LAB — BASIC METABOLIC PANEL WITH GFR
BUN/Creatinine Ratio: 23 (ref 12–28)
BUN: 15 mg/dL (ref 8–27)
CO2: 27 mmol/L (ref 20–29)
Calcium: 9.6 mg/dL (ref 8.7–10.3)
Chloride: 93 mmol/L — ABNORMAL LOW (ref 96–106)
Creatinine, Ser: 0.64 mg/dL (ref 0.57–1.00)
Glucose: 86 mg/dL (ref 70–99)
Potassium: 4.1 mmol/L (ref 3.5–5.2)
Sodium: 135 mmol/L (ref 134–144)
eGFR: 89 mL/min/1.73 (ref >59)

## 2024-07-21 MED ORDER — NITROGLYCERIN 0.4 MG SL SUBL
0.8000 mg | SUBLINGUAL_TABLET | Freq: Once | SUBLINGUAL | Status: AC
  Administered 2024-07-21: 0.8 mg via SUBLINGUAL

## 2024-07-21 MED ORDER — IOHEXOL 350 MG/ML SOLN
100.0000 mL | Freq: Once | INTRAVENOUS | Status: AC | PRN
  Administered 2024-07-21: 100 mL via INTRAVENOUS

## 2024-07-22 ENCOUNTER — Ambulatory Visit: Payer: Self-pay

## 2024-07-31 ENCOUNTER — Ambulatory Visit
Admission: RE | Admit: 2024-07-31 | Discharge: 2024-07-31 | Disposition: A | Discharge status: Home / Self Care | Source: Ambulatory Visit | Attending: Neurology | Admitting: Neurology

## 2024-07-31 DIAGNOSIS — R42 Dizziness and giddiness: Secondary | ICD-10-CM

## 2024-07-31 DIAGNOSIS — D329 Benign neoplasm of meninges, unspecified: Secondary | ICD-10-CM

## 2024-07-31 MED ORDER — GADOPICLENOL 0.5 MMOL/ML IV SOLN
7.0000 mL | Freq: Once | INTRAVENOUS | Status: AC | PRN
  Administered 2024-07-31: 7 mL via INTRAVENOUS

## 2024-08-01 ENCOUNTER — Ambulatory Visit: Payer: Self-pay | Admitting: Neurology

## 2024-08-10 NOTE — Progress Notes (Signed)
 Rena Hunke                                          MRN: [id number]   08/10/2024   The VBCI Quality Team Specialist reviewed this patient medical record for the purposes of chart review for care gap closure. The following were reviewed: chart review for care gap closure-controlling blood pressure.    VBCI Quality Team

## 2024-09-22 ENCOUNTER — Ambulatory Visit: Admitting: Gastroenterology

## 2024-09-22 ENCOUNTER — Encounter: Payer: Self-pay | Admitting: Gastroenterology

## 2024-09-22 VITALS — BP 170/72 | HR 60 | Wt 157.4 lb

## 2024-09-22 DIAGNOSIS — Z758 Other problems related to medical facilities and other health care: Secondary | ICD-10-CM

## 2024-09-22 DIAGNOSIS — R1084 Generalized abdominal pain: Secondary | ICD-10-CM | POA: Diagnosis not present

## 2024-09-22 DIAGNOSIS — R197 Diarrhea, unspecified: Secondary | ICD-10-CM

## 2024-09-22 DIAGNOSIS — K219 Gastro-esophageal reflux disease without esophagitis: Secondary | ICD-10-CM | POA: Diagnosis not present

## 2024-09-22 DIAGNOSIS — R1310 Dysphagia, unspecified: Secondary | ICD-10-CM | POA: Diagnosis not present

## 2024-09-22 DIAGNOSIS — Z603 Acculturation difficulty: Secondary | ICD-10-CM

## 2024-09-22 MED ORDER — NA SULFATE-K SULFATE-MG SULF 17.5-3.13-1.6 GM/177ML PO SOLN: 1.0000 | Freq: Once | ORAL | 0 refills | Status: AC

## 2024-09-22 NOTE — Patient Instructions (Addendum)
 Please purchase the following medications over the counter and take as directed: Imodium take 2 tablets by mouth after first loose bowel movement on the first day. Do not take any more that first day. Then you can take additional tablets as needed the following days. Do not exceed 8 tablets per day.  You have been scheduled for an endoscopy and colonoscopy. Please follow the written instructions given to you at your visit today.  If you use inhalers (even only as needed), please bring them with you on the day of your procedure.  DO NOT TAKE 7 DAYS PRIOR TO TEST- Trulicity (dulaglutide) Ozempic, Wegovy (semaglutide) Mounjaro, Zepbound (tirzepatide) Bydureon Bcise (exanatide extended release)  DO NOT TAKE 1 DAY PRIOR TO YOUR TEST Rybelsus (semaglutide) Adlyxin (lixisenatide) Victoza (liraglutide) Byetta (exanatide) ________________________________________________________________________  _______________________________________________________  If your blood pressure at your visit was 140/90 or greater, please contact your primary care physician to follow up on this.  _______________________________________________________  If you are age 22 or older, your body mass index should be between 23-30. Your Body mass index is 31.79 kg/m. If this is out of the aforementioned range listed, please consider follow up with your Primary Care Provider.  If you are age 27 or younger, your body mass index should be between 19-25. Your Body mass index is 31.79 kg/m. If this is out of the aformentioned range listed, please consider follow up with your Primary Care Provider.   ________________________________________________________  The [facility] providers would like to encourage you to use MYCHART to communicate with providers for non-urgent requests or questions.  Due to long hold times on the telephone, sending your provider a message by [facility] may be a faster and more efficient way to get a  response.  Please allow 48 business hours for a response.  Please remember that this is for non-urgent requests.  _______________________________________________________  [facility] is using a team-based approach to care.  Your team is made up of your doctor and two to three APPS. Our APPS (Nurse Practitioners and Physician Assistants) work with your physician to ensure care continuity for you. They are fully qualified to address your health concerns and develop a treatment plan. They communicate directly with your gastroenterologist to care for you. Seeing the Advanced Practice Practitioners on your physician's team can help you by facilitating care more promptly, often allowing for earlier appointments, access to diagnostic testing, procedures, and other specialty referrals.

## 2024-09-22 NOTE — Progress Notes (Signed)
 Discussed the use of AI scribe software for clinical note transcription with the patient, who gave verbal consent to proceed.  HPI : Ashley [name] is an 78 year old female who is referred to us  by Dr. Quintin sheerer [place] with multiple GI symptoms.  She is accompanied by a Spanish language interpreter.  She describes a significant change in bowel habits over the past six months, with episodes of diarrhea occurring every three days, accompanied by abdominal pain. The pain precedes the diarrhea, and she experiences urgency and incontinence, sometimes not reaching the bathroom in time. She has bowel movements 5-6 times a day during these episodes, starting with small hard stools followed by diarrhea. She also sometimes has constipation with hard stools that are difficult to evacuate.  Sometimes has nocturna stools, but this is rare.  She feels full all the time and reports decreased appetite.  She denies blood in the stool.  Her weight has been stable.  She denies any recent antibiotics.  No recent changes in diet or medication changes accompanying the change in bowel habits.  She has reduced her red meat intake, eating it about once a month due to her children's vegetarian diet. She has not had a colonoscopy before.   She also has been experiencing worsening symptoms of GERD and dysphagia. She was previously seen by [facility] for GERD sympoms and dysphagia.  A barium swallow in January 2023 showed spontaneous reflux, mildly delayed passage past the gastroesophageal junction, and mild esophageal dilation. She was recommended to undergo EGD, but this was never completed. She has been taking esomeprazole  daily for 2-3 years, which provides temporary relief but symptoms return, including daily reflux and coughing up 'acid water'.   She doesn't think she has taken other GERD medications besides esomeprazole .  Reflux symptoms occur daily.  Symptoms worse at night at in supine position. She  frequently experiences swallowing difficulties, feeling as though food gets stuck in her esophagus, but has not had to forcefully regurgitate food.  Her family history is significant for an older sister who was diagnosed with colon cancer at age 46 or 48 and is still living after treatment.  She is originally from [place] but has lived in the [place]  for [place] years.  She went back to [place] earlier this year and was having these same GI symptoms.  She saw a doctor there for these symptoms.  She reports that many tests were done, but she never found out any of the results.   PRIOR DIAGNOSTIC EVALUATION:  12/24/21 barium esophagram:  Impression  *Patulous appearance of the esophagus with somewhat delayed passage of contrast beyond the GE junction and into the stomach. While nonspecific, these findings could potentially be seen in the setting of esophageal dysmotility.  *Note made of spontaneous gastroesophageal reflux to the level of the lower thoracic esophagus (just proximal to the level of the GE junction).   FINDINGS:  The patient was given oral contrast to swallow which the patient did without difficulty.   The esophageal mucosa was normal in appearance, with no mucosal lesions, ulcerations, polypoid filling defects or strictures. The esophagus was, however, diffusely dilated with somewhat delayed passage of contrast from the distal thoracic into the stomach.   There was no evidence of a hiatal hernia, esophageal ring or web. Trace spontaneous gastroesophageal reflux was noted on upright imaging to the level of the lower thoracic esophagus (just proximal to the level of the GE junction).   The patient was given a 12.5  mm barium tablet which passed without difficulty.   Evaluation of the cervical esophagus with videofluoroscopy and rapid filming was unremarkable.   The remainder of the partially included chest, including the cardiomediastinal silhouette, lungs, bones and soft tissues were normal.    Past Medical History:  Diagnosis Date   Amaurosis fugax    Carotid body tumor (HCC)    GERD (gastroesophageal reflux disease)    Hyperlipidemia    Hypertension    Meningioma (HCC)    Mood disorder    Osteoporosis    Paraganglioma (HCC)    SVT (supraventricular tachycardia)    TIA (transient ischemic attack)      Past Surgical History:  Procedure Laterality Date   CHOLECYSTECTOMY     HERNIA REPAIR     IR TRANSCATH/EMBOLIZ     OTHER SURGICAL HISTORY  09/14/2018   carotid lesion   Family History  Problem Relation Age of Onset   CAD Father 52   Colon cancer Sister    Breast cancer Sister    Head & neck cancer Brother    CAD Brother 88       MI   Social History   Tobacco Use   Smoking status: Never    Passive exposure: Never   Smokeless tobacco: Never  Vaping Use   Vaping status: Never Used  Substance Use Topics   Alcohol use: No   Drug use: No   Current Outpatient Medications  Medication Sig Dispense Refill   amLODipine  (NORVASC ) 5 MG tablet Take 1 tablet (5 mg total) by mouth in the morning. 90 tablet 3   aspirin  EC 81 MG tablet Take 1 tablet (81 mg total) by mouth daily. Swallow whole. 30 tablet 5   atorvastatin  (LIPITOR) 20 MG tablet Take 1 tablet (20 mg total) by mouth daily. 90 tablet 1   carvedilol  (COREG ) 6.25 MG tablet Take 1 tablet (6.25 mg total) by mouth 2 (two) times daily with a meal. 180 tablet 0   chlorthalidone  (HYGROTON ) 25 MG tablet Take 1 tablet (25 mg total) by mouth daily. 90 tablet 1   Cholecalciferol  50 MCG (2000 UT) TABS Take 1 tablet (2,000 Units total) by mouth daily. 30 tablet 3   diclofenac Sodium (VOLTAREN) 1 % GEL Apply 4 g topically 4 (four) times daily. Use for both knees     esomeprazole  (NEXIUM ) 20 MG capsule Take 1 capsule (20 mg total) by mouth at bedtime. 90 capsule 1   valsartan  (DIOVAN ) 320 MG tablet Take 1 tablet (320 mg total) by mouth every evening. 90 tablet 3   zoledronic acid (RECLAST) 5 MG/100ML SOLN injection  Inject 5 mg into the vein once.     No current facility-administered medications for this visit.   No Known Allergies   Review of Systems: All systems reviewed and negative except where noted in HPI.    No results found.  Physical Exam: BP (!) 170/72   Pulse 60   Wt 157 lb 6 oz (71.4 kg)   BMI 31.79 kg/m  Constitutional: Pleasant,well-developed, Hispanic female in no acute distress.  Accompanied by Spanish language interpreter. HEENT: Normocephalic and atraumatic. Conjunctivae are normal. No scleral icterus. Neck supple.  Cardiovascular: Normal rate, regular rhythm.  Pulmonary/chest: Effort normal and breath sounds normal. No wheezing, rales or rhonchi. Abdominal: Soft, nondistended, nontender. Bowel sounds active throughout. There are no masses palpable. No hepatomegaly. Extremities: no edema Lymphadenopathy: No cervical adenopathy noted. Neurological: Alert and oriented to person place and time. Skin: Skin is warm and  dry. No rashes noted. Psychiatric: Normal mood and affect. Behavior is normal.  CBC    Component Value Date/Time   WBC 8.0 07/07/2024 1517   WBC 8.1 09/21/2023 1719   RBC 3.97 07/07/2024 1517   RBC 4.17 09/21/2023 1719   HGB 13.0 07/07/2024 1517   HCT 39.4 07/07/2024 1517   PLT 170 07/07/2024 1517   MCV 99 (H) 07/07/2024 1517   MCH 32.7 07/07/2024 1517   MCH 31.7 09/21/2023 1719   MCHC 33.0 07/07/2024 1517   MCHC 32.7 09/21/2023 1719   RDW 13.2 07/07/2024 1517   LYMPHSABS 2.5 07/07/2024 1517   MONOABS 0.5 12/21/2017 1211   EOSABS 0.2 07/07/2024 1517   BASOSABS 0.0 07/07/2024 1517    CMP     Component Value Date/Time   NA 135 07/20/2024 1502   K 4.1 07/20/2024 1502   CL 93 (L) 07/20/2024 1502   CO2 27 07/20/2024 1502   GLUCOSE 86 07/20/2024 1502   GLUCOSE 95 09/21/2023 1719   BUN 15 07/20/2024 1502   CREATININE 0.64 07/20/2024 1502   CREATININE 0.51 (L) 08/15/2022 1401   CALCIUM  9.6 07/20/2024 1502   PROT 7.4 07/07/2024 1517   ALBUMIN  4.5 07/07/2024 1517   AST 19 07/07/2024 1517   ALT 22 07/07/2024 1517   ALKPHOS 84 07/07/2024 1517   BILITOT 1.2 07/07/2024 1517   GFRNONAA >60 09/21/2023 1719       Latest Ref Rng & Units 07/07/2024    3:17 PM 09/21/2023    5:19 PM 12/21/2017   12:28 PM  CBC EXTENDED  WBC 3.4 - 10.8 x10E3/uL 8.0  8.1    RBC 3.77 - 5.28 x10E6/uL 3.97  4.17    Hemoglobin 11.1 - 15.9 g/dL 86.9  86.7  87.0   HCT 34.0 - 46.6 % 39.4  40.4  38.0   Platelets 150 - 450 x10E3/uL 170  138    NEUT# 1.4 - 7.0 x10E3/uL 4.6     Lymph# 0.7 - 3.1 x10E3/uL 2.5         ASSESSMENT AND PLAN:  78 year old female with family history of colon cancer, no prior colonoscopy and recent change in bowel habits characterized by frequent diarrhea with urgency and incontinence, as well as periods of constipation and difficulty evacuating hard stool.  She denies any prior history of difficulty with bowel habits. She also has been having worsening GERD symptoms and dysphagia despite PPI therapy. A colonoscopy is warranted to exclude mass lesion as a cause of her change in bowel habits, especially given her family history of colon cancer and no previous colonoscopy.  EGD warranted to evaluate refractory GERD symptoms and dysphagia. In the meantime, recommended that the patient use Imodium on a as needed basis for when she is having diarrhea.  Recommend she take 2 tablets the first day, to see how this affects her, and then she can increase dosing as needed to include 1 tablet after each loose bowel movement, max dose 8/day.  Given the episodic nature of her symptoms, also recommended that she maintain a food diary to see if there any potential dietary triggers for her diarrhea.   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 Differential includes IBS, microscopic colitis, less likely mass in the colon or other  structural abnormalities. - Scheduled colonoscopy to evaluate for mass or structural abnormalities in the colon. - Recommended Imodium as  needed for diarrhea management, starting with two tablets and not exceeding eight tablets per day. - Advised keeping a food diary to identify potential dietary triggers for diarrhea.  Gastroesophageal reflux disease (GERD) with dysphagia Long-standing GERD with daily symptoms and dysphagia. Previous barium swallow showed spontaneous reflux and mild esophageal dilation. Possible esophageal narrowing due to acid reflux-related inflammation and scarring. - Scheduled upper endoscopy to assess for esophageal narrowing or other abnormalities related to GERD.  Constipation Intermittent constipation over the past six months with episodes of hard stools and difficulty evacuating. - Continue to monitor bowel habits and dietary intake.  Family history of colon cancer (sister) -Overdue for initial screening colonoscopy  Language barrier - Significantly increased length of time of visit and discussing multiple symptoms, as well as discussing the details, risks and benefits of the endoscopic procedures  The details, risks (including bleeding, perforation, infection, missed lesions, medication reactions and possible hospitalization or surgery if complications occur), benefits, and alternatives to EGD and colonoscopy with possible biopsy and possible polypectomy were discussed with the patient and he/she consents to proceed.    Recording duration: 39 minutes     Arran Fessel E. Stacia, MD [facility]    CC:  de [place], Quintin PARAS, MD

## 2024-09-25 ENCOUNTER — Other Ambulatory Visit: Payer: Self-pay | Admitting: Nurse Practitioner

## 2024-10-17 ENCOUNTER — Ambulatory Visit (INDEPENDENT_AMBULATORY_CARE_PROVIDER_SITE_OTHER): Admitting: Family Medicine

## 2024-10-17 ENCOUNTER — Encounter (HOSPITAL_BASED_OUTPATIENT_CLINIC_OR_DEPARTMENT_OTHER): Payer: Self-pay | Admitting: Family Medicine

## 2024-10-17 VITALS — BP 166/80 | HR 80 | Temp 98.0°F | Resp 18 | Ht 59.0 in | Wt 153.0 lb

## 2024-10-17 DIAGNOSIS — L989 Disorder of the skin and subcutaneous tissue, unspecified: Secondary | ICD-10-CM | POA: Diagnosis not present

## 2024-10-17 DIAGNOSIS — I10 Essential (primary) hypertension: Secondary | ICD-10-CM

## 2024-10-17 DIAGNOSIS — E042 Nontoxic multinodular goiter: Secondary | ICD-10-CM | POA: Insufficient documentation

## 2024-10-17 DIAGNOSIS — N83202 Unspecified ovarian cyst, left side: Secondary | ICD-10-CM | POA: Diagnosis not present

## 2024-10-17 NOTE — Assessment & Plan Note (Signed)
 Left ovarian cyst observed on CT scan in November 2024 with recommendation for US  in 6-12 months for monitoring. Order placed at this time to arrange for follow-up ultrasound

## 2024-10-17 NOTE — Assessment & Plan Note (Signed)
 Patient notes that when she was in [place], she had ultrasound of thyroid  completed with findings of nodules within thyroid .  She reports being told that she would need to have follow-up on this.  She does not recall additional details regarding size of nodules, thinks there may have been about 4 nodules on left side.

## 2024-10-17 NOTE — Assessment & Plan Note (Signed)
 Patient notes that she has had skin lesion over right cheek which has been present for some time.  Over the past few months, she has had more itching with this area.  Due to this, she is wanting to have further evaluation with dermatologist. On exam, there is a small, slightly raised lesion with stuck on appearance of her right cheek.  No significant redness or irritation noted.  No oozing or drainage. 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  Can proceed with referral to dermatology at this time.

## 2024-10-17 NOTE — Assessment & Plan Note (Signed)
 Blood pressure elevated in office today.  She does have paper with readings from home over the last week or so.  She has had some variability in blood pressure readings with most readings being slightly above goal, occasional low readings which are infrequent.  She has noted that at times she has had some dizziness when experiencing these low blood pressures. She did have appointment with cardiology a few months ago and medications were changed at that time.  Her dose of valsartan  was increased and she was restarted on amlodipine . Unfortunately, she has noted some issues with leg swelling recently.  Of note, patient has had issues with leg swelling when utilizing amlodipine  in the past, no current issues with chest pain or headaches. On exam, patient is no acute distress, vital signs stable.  She does have swelling of bilateral lower extremities, left worse than right today. We discussed considerations.  Ultimately, I do feel that blood pressure control could be optimized.  Related to her leg swelling, I do feel that this is likely related to amlodipine  given that she has had issues with this in the past.  Thus, would recommend stopping amlodipine  I would also recommend reaching out to cardiology office as they did discuss likelihood of meeting with Pharm.D. within their office to assist with blood pressure management.  She indicates that she does have a phone number to cardiology office and she will contact them. Recommend intermittent monitoring of blood pressure at home, DASH diet

## 2024-10-17 NOTE — Progress Notes (Signed)
 Procedures performed today:    None.  Independent interpretation of notes and tests performed by another provider:   None.  Brief History, Exam, Impression, and Recommendations:    BP (!) 166/80 (BP Location: Left Arm, Patient Position: Sitting, Cuff Size: Normal)   Pulse 80   Temp 98 F (36.7 C) (Oral)   Resp 18   Ht 4' 11 (1.499 m)   Wt 153 lb (69.4 kg)   SpO2 99%   BMI 30.90 kg/m   In person interpreter utilized for office visit  Left ovarian cyst Assessment & Plan: Left ovarian cyst observed on CT scan in November 2024 with recommendation for US  in 6-12 months for monitoring. Order placed at this time to arrange for follow-up ultrasound  Orders: -     US  Abdomen Limited; Future  Hypertension, benign Assessment & Plan: Blood pressure elevated in office today.  She does have paper with readings from home over the last week or so.  She has had some variability in blood pressure readings with most readings being slightly above goal, occasional low readings which are infrequent.  She has noted that at times she has had some dizziness when experiencing these low blood pressures. She did have appointment with cardiology a few months ago and medications were changed at that time.  Her dose of valsartan  was increased and she was restarted on amlodipine . Unfortunately, she has noted some issues with leg swelling recently.  Of note, patient has had issues with leg swelling when utilizing amlodipine  in the past, no current issues with chest pain or headaches. On exam, patient is no acute distress, vital signs stable.  She does have swelling of bilateral lower extremities, left worse than right today. We discussed considerations.  Ultimately, I do feel that blood pressure control could be optimized.  Related to her leg swelling, I do feel that this is likely related to amlodipine  given that she has had issues with this in the past.  Thus, would recommend stopping amlodipine  I would  also recommend reaching out to cardiology office as they did discuss likelihood of meeting with Pharm.D. within their office to assist with blood pressure management.  She indicates that she does have a phone number to cardiology office and she will contact them. Recommend intermittent monitoring of blood pressure at home, DASH diet   Multiple thyroid  nodules Assessment & Plan: Patient notes that when she was in [place], she had ultrasound of thyroid  completed with findings of nodules within thyroid .  She reports being told that she would need to have follow-up on this.  She does not recall additional details regarding size of nodules, thinks there may have been about 4 nodules on left side.  Orders: -     US  THYROID ; Future  Skin lesion of face Assessment & Plan: Patient notes that she has had skin lesion over right cheek which has been present for some time.  Over the past few months, she has had more itching with this area.  Due to this, she is wanting to have further evaluation with dermatologist. On exam, there is a small, slightly raised lesion with stuck on appearance of her right cheek.  No significant redness or irritation noted.  No oozing or drainage. 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  Can proceed with referral to dermatology at this time.  Orders: -     Ambulatory referral to Dermatology  Return in about 4 months (around 02/15/2025) for hypertension, med check.   ___________________________________________ Aiman Sonn de Cuba, MD, ABFM, [facility] Medicine [facility]

## 2024-10-24 ENCOUNTER — Encounter: Payer: Self-pay | Admitting: Gastroenterology

## 2024-10-24 ENCOUNTER — Ambulatory Visit: Admitting: Gastroenterology

## 2024-10-24 VITALS — BP 119/88 | HR 48 | Temp 97.3°F | Resp 13 | Ht 62.0 in | Wt 157.0 lb

## 2024-10-24 DIAGNOSIS — K3189 Other diseases of stomach and duodenum: Secondary | ICD-10-CM | POA: Diagnosis not present

## 2024-10-24 DIAGNOSIS — D123 Benign neoplasm of transverse colon: Secondary | ICD-10-CM | POA: Diagnosis not present

## 2024-10-24 DIAGNOSIS — R1084 Generalized abdominal pain: Secondary | ICD-10-CM

## 2024-10-24 DIAGNOSIS — R197 Diarrhea, unspecified: Secondary | ICD-10-CM

## 2024-10-24 DIAGNOSIS — R1310 Dysphagia, unspecified: Secondary | ICD-10-CM

## 2024-10-24 DIAGNOSIS — K2289 Other specified disease of esophagus: Secondary | ICD-10-CM

## 2024-10-24 DIAGNOSIS — K219 Gastro-esophageal reflux disease without esophagitis: Secondary | ICD-10-CM

## 2024-10-24 MED ORDER — SODIUM CHLORIDE 0.9 % IV SOLN: 500.0000 mL | INTRAVENOUS | Status: DC

## 2024-10-24 NOTE — Op Note (Signed)
 [facility] Patient Name: Ashley [name] Procedure Date: 10/24/2024 1:22 PM MRN: [id number] Endoscopist: Glendia E. Stacia , MD, [id number] Age: 78 Referring MD:  Date of Birth: 12-Dec-1945 Gender: Female Account #: [id number] Procedure:                Colonoscopy Indications:              This is the patient's first colonoscopy, Chronic                            diarrhea Medicines:                Monitored Anesthesia Care Procedure:                Pre-Anesthesia Assessment:                           - Prior to the procedure, a History and Physical                            was performed, and patient medications and                            allergies were reviewed. The patient's tolerance of                            previous anesthesia was also reviewed. The risks                            and benefits of the procedure and the sedation                            options and risks were discussed with the patient.                            All questions were answered, and informed consent                            was obtained. Prior Anticoagulants: The patient has                            taken no anticoagulant or antiplatelet agents. ASA                            Grade Assessment: III - A patient with severe                            systemic disease. After reviewing the risks and                            benefits, the patient was deemed in satisfactory                            condition to undergo the procedure.  After obtaining informed consent, the colonoscope                            was passed under direct vision. Throughout the                            procedure, the patient's blood pressure, pulse, and                            oxygen saturations were monitored continuously. The                            Olympus Scope [id number] was introduced through the                            anus and advanced to the the  terminal ileum, with                            identification of the appendiceal orifice and IC                            valve. The colonoscopy was performed without                            difficulty. The patient tolerated the procedure                            well. The quality of the bowel preparation was                            excellent. The terminal ileum, ileocecal valve,                            appendiceal orifice, and rectum were photographed.                            The bowel preparation used was SUPREP via split                            dose instruction. Scope In: 1:46:31 PM Scope Out: 2:02:12 PM Scope Withdrawal Time: 0 hours 10 minutes 51 seconds  Total Procedure Duration: 0 hours 15 minutes 41 seconds  Findings:                 The perianal and digital rectal examinations were                            normal. Pertinent negatives include normal                            sphincter tone and no palpable rectal lesions.                           A 6 mm polyp was found in the distal transverse  colon. The polyp was sessile. The polyp was removed                            with a cold snare. Resection and retrieval were                            complete. Estimated blood loss was minimal.                           Normal mucosa was found in the entire colon.                            Biopsies for histology were taken with a cold                            forceps from the ascending colon, transverse colon,                            descending colon and sigmoid colon for evaluation                            of microscopic colitis. Estimated blood loss was                            minimal.                           The terminal ileum appeared normal.                           The retroflexed view of the distal rectum and anal                            verge was normal and showed no anal or rectal                             abnormalities. Complications:            No immediate complications. Estimated Blood Loss:     Estimated blood loss was minimal. Impression:               - One 6 mm polyp in the distal transverse colon,                            removed with a cold snare. Resected and retrieved.                           - Normal mucosa in the entire examined colon.                            Biopsied.                           - The examined portion of the ileum was normal.                           -  The distal rectum and anal verge are normal on                            retroflexion view.                           - No endoscopic abnormalities to explain patient's                            symptoms. Recommendation:           - Patient has a contact number available for                            emergencies. The signs and symptoms of potential                            delayed complications were discussed with the                            patient. Return to normal activities tomorrow.                            Written discharge instructions were provided to the                            patient.                           - Resume previous diet.                           - Continue present medications.                           - Await pathology results.                           - Given patient's age and lack of high risk polyps,                            recommend against any further colon cancer screening Jaekwon Mcclune E. Stacia, MD 10/24/2024 2:15:13 PM This report has been signed electronically.

## 2024-10-24 NOTE — Progress Notes (Signed)
 Pt's states no medical or surgical changes since previsit or office visit.

## 2024-10-24 NOTE — Progress Notes (Signed)
 Called to room to assist during endoscopic procedure.  Patient ID and intended procedure confirmed with present staff. Received instructions for my participation in the procedure from the performing physician.

## 2024-10-24 NOTE — Patient Instructions (Signed)
 YOU HAD AN ENDOSCOPIC PROCEDURE TODAY AT THE [facility]:   Refer to the procedure report that was given to you for any specific questions about what was found during the examination.  If the procedure report does not answer your questions, please call your gastroenterologist to clarify.  If you requested that your care partner not be given the details of your procedure findings, then the procedure report has been included in a sealed envelope for you to review at your convenience later.  YOU SHOULD EXPECT: Some feelings of bloating in the abdomen. Passage of more gas than usual.  Walking can help get rid of the air that was put into your GI tract during the procedure and reduce the bloating. If you had a lower endoscopy (such as a colonoscopy or flexible sigmoidoscopy) you may notice spotting of blood in your stool or on the toilet paper. If you underwent a bowel prep for your procedure, you may not have a normal bowel movement for a few days.  Please Note:  You might notice some irritation and congestion in your nose or some drainage.  This is from the oxygen used during your procedure.  There is no need for concern and it should clear up in a day or so.  SYMPTOMS TO REPORT IMMEDIATELY:  Following lower endoscopy (colonoscopy or flexible sigmoidoscopy):  Excessive amounts of blood in the stool  Significant tenderness or worsening of abdominal pains  Swelling of the abdomen that is new, acute  Fever of 100F or higher  Following upper endoscopy (EGD)  Vomiting of blood or coffee ground material  New chest pain or pain under the shoulder blades  Painful or persistently difficult swallowing  New shortness of breath  Fever of 100F or higher  Black, tarry-looking stools  Resume previous diet Continue present medications Await pathology results No further colonoscopies recommended due to age   For urgent or emergent issues, a gastroenterologist can be reached at any hour by  calling [phone]. Do not use MyChart messaging for urgent concerns.    DIET:  We do recommend a small meal at first, but then you may proceed to your regular diet.  Drink plenty of fluids but you should avoid alcoholic beverages for 24 hours.  ACTIVITY:  You should plan to take it easy for the rest of today and you should NOT DRIVE or use heavy machinery until tomorrow (because of the sedation medicines used during the test).    FOLLOW UP: Our staff will call the number listed on your records the next business day following your procedure.  We will call around 7:15- 8:00 am to check on you and address any questions or concerns that you may have regarding the information given to you following your procedure. If we do not reach you, we will leave a message.     If any biopsies were taken you will be contacted by phone or by letter within the next 1-3 weeks.  Please call us  at [phone] if you have not heard about the biopsies in 3 weeks.    SIGNATURES/CONFIDENTIALITY: You and/or your care partner have signed paperwork which will be entered into your electronic medical record.  These signatures attest to the fact that that the information above on your After Visit Summary has been reviewed and is understood.  Full responsibility of the confidentiality of this discharge information lies with you and/or your care-partner.

## 2024-10-24 NOTE — Progress Notes (Signed)
 Sedate, gd SR, tolerated procedure well, VSS, report to RN

## 2024-10-24 NOTE — Progress Notes (Signed)
 [facility] History and Physical   Primary Care Physician:  de Cuba, Quintin PARAS, MD   Reason for Procedure:   Chronic diarrhea, abdominal pain, dysphagia  Plan:    EGD, colonoscopy   HPI: Ashley [name] is a 78 y.o. female undergoing EGD and colonoscopy to evaluate recent persistent diarrhea, fecal incontinence, abdominal pain, GERD symptoms and dysphagia.  She has never had a colonoscopy or upper endoscopy before.  Her sister was diagnosed with colon cancer in her 73s.     The patient was provided an opportunity to ask questions and all were answered. The patient agreed with the plan   Past Medical History:  Diagnosis Date   Amaurosis fugax    Carotid body tumor (HCC)    GERD (gastroesophageal reflux disease)    Hyperlipidemia    Hypertension    Meningioma (HCC)    Mood disorder    Osteoporosis    Paraganglioma (HCC)    SVT (supraventricular tachycardia)    TIA (transient ischemic attack)     Past Surgical History:  Procedure Laterality Date   CHOLECYSTECTOMY     HERNIA REPAIR     IR TRANSCATH/EMBOLIZ     OTHER SURGICAL HISTORY  09/14/2018   carotid lesion    Prior to Admission medications   Medication Sig Start Date End Date Taking? Authorizing Provider  atorvastatin  (LIPITOR) 20 MG tablet Take 1 tablet (20 mg total) by mouth daily. 01/18/24  Yes Wyn Jackee VEAR Mickey., NP  carvedilol  (COREG ) 6.25 MG tablet Take 1 tablet (6.25 mg total) by mouth 2 (two) times daily with a meal. 02/22/24  Yes de [place], Raymond J, MD  chlorthalidone  (HYGROTON ) 25 MG tablet Take 1 tablet (25 mg total) by mouth daily. 01/18/24  Yes Wyn Jackee VEAR Mickey., NP  esomeprazole  (NEXIUM ) 20 MG capsule Take 1 capsule (20 mg total) by mouth at bedtime. 02/22/24  Yes de [place], Raymond J, MD  valsartan  (DIOVAN ) 320 MG tablet Take 1 tablet (320 mg total) by mouth every evening. 07/13/24  Yes Tolia, Sunit, DO  aspirin  EC 81 MG tablet TAKE 1 TABLET (81 MG TOTAL) BY MOUTH DAILY. SWALLOW WHOLE. 09/27/24    Tolia, Sunit, DO  Cholecalciferol  50 MCG (2000 UT) TABS Take 1 tablet (2,000 Units total) by mouth daily. 01/18/24   Dick, Ernest H Jr., NP  diclofenac Sodium (VOLTAREN) 1 % GEL Apply 4 g topically 4 (four) times daily. Use for both knees    [provider]  zoledronic acid (RECLAST) 5 MG/100ML SOLN injection Inject 5 mg into the vein once.    [provider]    Current Outpatient Medications  Medication Sig Dispense Refill   atorvastatin  (LIPITOR) 20 MG tablet Take 1 tablet (20 mg total) by mouth daily. 90 tablet 1   carvedilol  (COREG ) 6.25 MG tablet Take 1 tablet (6.25 mg total) by mouth 2 (two) times daily with a meal. 180 tablet 0   chlorthalidone  (HYGROTON ) 25 MG tablet Take 1 tablet (25 mg total) by mouth daily. 90 tablet 1   esomeprazole  (NEXIUM ) 20 MG capsule Take 1 capsule (20 mg total) by mouth at bedtime. 90 capsule 1   valsartan  (DIOVAN ) 320 MG tablet Take 1 tablet (320 mg total) by mouth every evening. 90 tablet 3   aspirin  EC 81 MG tablet TAKE 1 TABLET (81 MG TOTAL) BY MOUTH DAILY. SWALLOW WHOLE. 90 tablet 2   Cholecalciferol  50 MCG (2000 UT) TABS Take 1 tablet (2,000 Units total) by mouth daily. 30 tablet 3  diclofenac Sodium (VOLTAREN) 1 % GEL Apply 4 g topically 4 (four) times daily. Use for both knees     zoledronic acid (RECLAST) 5 MG/100ML SOLN injection Inject 5 mg into the vein once.     Current Facility-Administered Medications  Medication Dose Route Frequency Provider Last Rate Last Admin   0.9 %  sodium chloride  infusion  500 mL Intravenous Continuous Stacia Glendia BRAVO, MD        Allergies as of 10/24/2024   (No Known Allergies)    Family History  Problem Relation Age of Onset   CAD Father 64   Colon cancer Sister    Breast cancer Sister    Head & neck cancer Brother    CAD Brother 28       MI   Rectal cancer Neg Hx    Stomach cancer Neg Hx     Social History   Socioeconomic History   Marital status: Married    Spouse name: Not on  file   Number of children: 5   Years of education: Not on file   Highest education level: Not on file  Occupational History   Not on file  Tobacco Use   Smoking status: Never    Passive exposure: Never   Smokeless tobacco: Never  Vaping Use   Vaping status: Never Used  Substance and Sexual Activity   Alcohol use: No   Drug use: No   Sexual activity: Not on file  Other Topics Concern   Not on file  Social History Narrative   Original from [place]   Moved to [place]  1999, moved  from [place] [employer] [place] to [place] 06-2022 to live w/ daughter and son in law En[place])    Married, don't have children in common , husband living in [place] as of 07-2022    2 son, 2 daughters   Lost a son   Social Drivers of Corporate Investment Banker Strain: Low Risk  (01/19/2024)   Overall Financial Resource Strain (CARDIA)    Difficulty of Paying Living Expenses: Not hard at all  Food Insecurity: No Food Insecurity (01/19/2024)   Hunger Vital Sign    Worried About Running Out of Food in the Last Year: Never true    Ran Out of Food in the Last Year: Never true  Transportation Needs: Unmet Transportation Needs (01/19/2024)   PRAPARE - Transportation    Lack of Transportation (Medical): Yes    Lack of Transportation (Non-Medical): Yes  Physical Activity: Insufficiently Active (10/13/2023)   Exercise Vital Sign    Days of Exercise per Week: 3 days    Minutes of Exercise per Session: 30 min  Stress: No Stress Concern Present (10/13/2023)   [employer] of Occupational Health - Occupational Stress Questionnaire    Feeling of Stress : Not at all  Social Connections: Moderately Integrated (10/13/2023)   Social Connection and Isolation Panel    Frequency of Communication with Friends and Family: More than three times a week    Frequency of Social Gatherings with Friends and Family: Once a week    Attends Religious Services: More than 4 times per year    Active Member of [employer] or Organizations: No    Attends Tax Inspector Meetings: Never    Marital Status: Married  Catering Manager Violence: Not At Risk (10/13/2023)   Humiliation, Afraid, Rape, and Kick questionnaire    Fear of Current or Ex-Partner: No    Emotionally Abused: No    Physically Abused: No  Sexually Abused: No    Review of Systems:  All other review of systems negative except as mentioned in the HPI.  Physical Exam: Vital signs BP (!) 185/88   Pulse (!) 51   Temp (!) 97.3 F (36.3 C)   Ht 5' 2 (1.575 m)   Wt 157 lb (71.2 kg)   SpO2 99%   BMI 28.72 kg/m   General:   Alert,  Well-developed, well-nourished, pleasant and cooperative in NAD Airway:  Mallampati 2 Lungs:  Clear throughout to auscultation.   Heart:  Regular rate and rhythm; no murmurs, clicks, rubs,  or gallops. Abdomen:  Soft, nontender and nondistended. Normal bowel sounds.   Neuro/Psych:  Normal mood and affect. A and O x 3   Adriell Polansky E. Stacia, MD [facility]

## 2024-10-24 NOTE — Op Note (Signed)
 [facility] Patient Name: Jahni Paul Procedure Date: 10/24/2024 1:22 PM MRN: [id number] Endoscopist: Glendia E. Stacia , MD, [id number] Age: 78 Referring MD:  Date of Birth: 27-Dec-1945 Gender: Female Account #: [id number] Procedure:                Upper GI endoscopy Indications:              Dysphagia Medicines:                Monitored Anesthesia Care Procedure:                Pre-Anesthesia Assessment:                           - Prior to the procedure, a History and Physical                            was performed, and patient medications and                            allergies were reviewed. The patient's tolerance of                            previous anesthesia was also reviewed. The risks                            and benefits of the procedure and the sedation                            options and risks were discussed with the patient.                            All questions were answered, and informed consent                            was obtained. Prior Anticoagulants: The patient has                            taken no anticoagulant or antiplatelet agents. ASA                            Grade Assessment: III - A patient with severe                            systemic disease. After reviewing the risks and                            benefits, the patient was deemed in satisfactory                            condition to undergo the procedure.                           After obtaining informed consent, the endoscope was  passed under direct vision. Throughout the                            procedure, the patient's blood pressure, pulse, and                            oxygen saturations were monitored continuously. The                            Olympus Scope SN [id number] was introduced through the                            mouth, and advanced to the second part of duodenum.                            The upper GI endoscopy was  accomplished without                            difficulty. The patient tolerated the procedure                            well. Scope In: Scope Out: Findings:                 The distal esophagus was moderately tortuous.                           The exam of the esophagus was otherwise normal.                           Normal mucosa was found in the entire esophagus.                            Biopsies were obtained from the proximal and distal                            esophagus with cold forceps for histology of                            suspected eosinophilic esophagitis. Estimated blood                            loss was minimal.                           A single diminutive erosion with no bleeding and no                            stigmata of recent bleeding was found in the                            prepyloric region of the stomach. Biopsies were                            taken with a  cold forceps for Helicobacter pylori                            testing. Estimated blood loss was minimal.                           The exam of the stomach was otherwise normal.                           The examined duodenum was normal. Biopsies for                            histology were taken with a cold forceps for                            evaluation of celiac disease. Estimated blood loss                            was minimal. Complications:            No immediate complications. Estimated Blood Loss:     Estimated blood loss was minimal. Impression:               - Tortuous distal esophagus.                           - Normal mucosa was found in the entire esophagus.                           - Erosive gastropathy with no bleeding and no                            stigmata of recent bleeding. Biopsied.                           - Normal examined duodenum. Biopsied.                           - Biopsies were taken with a cold forceps for                            evaluation of  eosinophilic esophagitis.                           - No obvious endoscopic abnormalities to explain                            patient's symptoms. Recommendation:           - Patient has a contact number available for                            emergencies. The signs and symptoms of potential                            delayed complications were discussed with the  patient. Return to normal activities tomorrow.                            Written discharge instructions were provided to the                            patient.                           - Resume previous diet.                           - Continue present medications.                           - Await pathology results.                           - Further recommendations will be based on                            pathology results. Ellary Casamento E. Stacia, MD 10/24/2024 2:10:18 PM This report has been signed electronically.

## 2024-10-24 NOTE — Progress Notes (Signed)
Pt's states no medical or surgical changes since previsit or office visit.   Interpreter used today at the [facility] for this pt.  Interpreter's name is-  Wellsite geologist

## 2024-10-25 ENCOUNTER — Telehealth: Payer: Self-pay | Admitting: *Deleted

## 2024-10-25 NOTE — Telephone Encounter (Signed)
 No answer for follow up call. Left a message.

## 2024-10-27 ENCOUNTER — Telehealth (HOSPITAL_BASED_OUTPATIENT_CLINIC_OR_DEPARTMENT_OTHER): Payer: Self-pay | Admitting: Family Medicine

## 2024-10-27 DIAGNOSIS — N83202 Unspecified ovarian cyst, left side: Secondary | ICD-10-CM

## 2024-10-27 LAB — SURGICAL PATHOLOGY

## 2024-10-27 NOTE — Telephone Encounter (Signed)
 Copied from CRM #[id number]. Topic: Clinical - Medical Advice >> Oct 27, 2024  8:52 AM Anairis L wrote: Reason for CRM: Order needs to be updated: US  Abdomen Limited Dx: Ovarian Cyst Abdomen Limited does not view the Ovaries. Please update order to US  Pelvic Complete with or with out Transvaginal.  Please resend orders.  Any questions call [phone] Option 1 then 5   Patient has not been able to be scheduled. 3rd call from [facility]. Thank you.

## 2024-10-27 NOTE — Telephone Encounter (Signed)
 Spoke with Leotis at [facility] who status ultrasound orders need to be changed to Ultrasound Pelvic Complete and it must specify with or without transvaginal.   Please advise.

## 2024-11-02 ENCOUNTER — Ambulatory Visit: Payer: Self-pay | Admitting: Gastroenterology

## 2024-11-02 NOTE — Progress Notes (Signed)
 [name],  The biopsies taken from you duodenum were unremarkable, with no evidence of celiac disease  The biopsies taken from your stomach were notable for mild reactive gastropathy which is a common finding and often related to use of certain medications (usually NSAIDs), but there was no evidence of Helicobacter pylori infection. This common finding is not felt to necessarily be a cause of any particular symptom and there is no specific treatment or further evaluation recommended.  The biopsies of your esophagus showed mild features suggestive of acid reflux in the distal esophagus, but no evidence of eosinophilic esophagitis to explain your swallowing difficulties.  The biopsies taken from your colon were normal and there was no evidence of microscopic colitis or chronic inflammatory changes that would explain chronic diarrhea.  The polyp which I removed from your colon was proven to be completely benign but is considered a pre-cancerous polyp that MAY have grown into cancer if it had not been removed.  Studies shows that at least 20% of women over age 29 and 30% of men over age 71 have pre-cancerous polyps.  Based on current nationally recognized surveillance guidelines, it would be recommended that you have a repeat colonoscopy in 7 years.  However, because colon cancer screening after age 37 is done on a case-by-case basis, taking into account the patient's risk factors for colon cancer, as well as comorbidities and life expectancy, I would recommend against any further colon cancer screening.  I recommend you take a daily fiber supplement such as Metamucil to improve your stool bulk and consistency.  Continue to take imodium as needed.  Follow up with me in the office as needed to discuss further management of your chronic symptoms.

## 2024-11-03 ENCOUNTER — Other Ambulatory Visit (HOSPITAL_BASED_OUTPATIENT_CLINIC_OR_DEPARTMENT_OTHER): Payer: Self-pay | Admitting: Family Medicine

## 2024-11-03 DIAGNOSIS — H02409 Unspecified ptosis of unspecified eyelid: Secondary | ICD-10-CM

## 2024-11-11 ENCOUNTER — Ambulatory Visit
Admission: RE | Admit: 2024-11-11 | Discharge: 2024-11-11 | Disposition: A | Discharge status: Home / Self Care | Source: Ambulatory Visit | Attending: Family Medicine | Admitting: Family Medicine

## 2024-11-11 ENCOUNTER — Other Ambulatory Visit

## 2024-11-11 DIAGNOSIS — E042 Nontoxic multinodular goiter: Secondary | ICD-10-CM

## 2024-11-11 DIAGNOSIS — N83202 Unspecified ovarian cyst, left side: Secondary | ICD-10-CM

## 2024-11-18 ENCOUNTER — Ambulatory Visit (HOSPITAL_BASED_OUTPATIENT_CLINIC_OR_DEPARTMENT_OTHER): Payer: Self-pay | Admitting: Family Medicine

## 2024-11-21 LAB — MUSK ANTIBODIES: MuSK Antibodies: <1 U/mL

## 2024-11-21 LAB — MYASTHENIA GRAVIS PROFILE
AChR Binding Ab, Serum: <0.07 nmol/L (ref 0.00–0.24)
AChR-modulating Ab: 0 % (ref 0–45)
Acetylchol Block Ab: 22 % (ref 0–25)
Anti-striation Abs: NEGATIVE

## 2024-11-23 ENCOUNTER — Encounter (INDEPENDENT_AMBULATORY_CARE_PROVIDER_SITE_OTHER): Admitting: Ophthalmology

## 2024-11-23 DIAGNOSIS — I10 Essential (primary) hypertension: Secondary | ICD-10-CM

## 2024-11-23 DIAGNOSIS — H35372 Puckering of macula, left eye: Secondary | ICD-10-CM

## 2024-11-23 DIAGNOSIS — H353112 Nonexudative age-related macular degeneration, right eye, intermediate dry stage: Secondary | ICD-10-CM

## 2024-11-23 DIAGNOSIS — H35033 Hypertensive retinopathy, bilateral: Secondary | ICD-10-CM | POA: Diagnosis not present

## 2024-11-23 DIAGNOSIS — H43813 Vitreous degeneration, bilateral: Secondary | ICD-10-CM | POA: Diagnosis not present

## 2024-11-23 DIAGNOSIS — D3132 Benign neoplasm of left choroid: Secondary | ICD-10-CM | POA: Diagnosis not present

## 2024-11-30 NOTE — Progress Notes (Addendum)
 Daughter helped interpret. Tx options discussed again and Ashley [name] chose today's tx plan. She reports she is decidedly not interested in any type of removable appiance - even after hopeless perio 18 removed. PGOTAQ. She requests we ext 18 next NV: Ext 18 - pt denies hx cancer, osteoclast inhibitor use, joint replacement surgeries, heart problems and blood thinner use

## 2024-11-30 NOTE — Telephone Encounter (Unsigned)
 Copied from CRM [id number]. Topic: General - Call Back - No Documentation >> Nov 30, 2024  4:19 PM Myrick T wrote: Reason for CRM: patients daughter stated she was returning a message left on her vm. No documentation. Please return call

## 2024-11-30 NOTE — Telephone Encounter (Signed)
 Per Dr. De [place], Ultrasound of thyroid  does show a total of 4 nodules present. One of the nodules is large enough that it would be recommended to have annual evaluation of this nodule to monitor for any changes. No additional testing or imaging would be needed at this time however.

## 2024-11-30 NOTE — Progress Notes (Addendum)
 [facility] 11/30/24 No primary care provider on file. Treatment Providers Amy Squier, [facility]  Dental procedures in this visit   [phone] - SCALING IN PRESENCE OF GENERALIZED MODERATE OR SEVERE GINGIVAL INFLAMMATION (Completed)    Service provider: Greig Manifold, [facility]    Billing provider: Oneil Potters, DDS   [phone] - PERIODIC ORAL EVALUATION - ESTABLISHED PATIENT (Completed)    Service provider: Oneil Potters, DDS    Billing provider: Oneil Potters, DDS    HEALTH HISTORY ? Vitals:  BP Readings from Last 1 Encounters:  11/30/24 (!) 147/76    Pulse:  56 Medical history was reviewed and updated. No contraindication to care. Medical History[1] Surgical History[2] Social History   Tobacco Use   Smoking status: Never   Smokeless tobacco: Never  Substance Use Topics   Alcohol use: Never   Family History[3] Medications Ordered Prior to Encounter[4] Medical Risk Assessment ASA GRADE: ASA 2 - Patient with mild systemic disease with no functional limitations  Subjective: Patient reports  having pain when she pushes on UR.   Objective:  Radiographs taken: No radiographs captured at this appointment of area:    Inflamed red gingival tissue, light to moderate calculus, and generalized pseudo-pocketing with no clinical attachment loss Oral cancer screening and extraoral examination:  Within normal limits and no abnormalties found Periodic oral examinication completed by Dr. Oneil Potters, DDS Reviewed tx plan and edited based on his findings.  #18 EXT #2 crown #5 crown #7 L   Periodontal Findings Periodontal Charting: Updated full mouth periodontal charting and documented all findings in patient's tooth chart: No 1-21mm periodontal pocketing with localized 4mm in the posteriors. Recession noted, if present. Moderate bleeding present, #18 gingiva was very inflamed Calculus: Moderate generalized calc present Color- Red Consistency: Generalized rolled and erthymatous  margins Radiographic studies shows moderate generalized horizontal bone loss   Assessment: Patient oral hygiene: Fair Patient present for Scaling in the presence of inflammation Discussed with patient risks and precautions. Necessary consent were obtained from patient.      Plan: Cavitron and handscale,  OHI: Reviewed brushing and flossing technique. Recommended rinsing with listerine mouthwash daily Additional notes:  None  Recommended recall: 4 weeks NV: Dr. appointment: #18 EXT 4 week re-eval  Treatment Providers Amy Squier, R[facility] Dr Oneil Potters, DDS [facility] [address] [place] [id number] [phone]       [1] Past Medical History: Diagnosis Date   Cardiovascular disease    Rheumatoid arthritis(714.0)   [2] History reviewed. No pertinent surgical history. [3] Family History Problem Relation Name Age of Onset   Cancer Mother     Heart attack Father    [4] Current Outpatient Medications on File Prior to Visit  Medication Sig Dispense Refill   aspirin  81 mg tablet Take 81 mg by mouth.     atorvastatin  (LIPITOR) 20 mg tablet Take 20 mg by mouth 1 (one) time each day.     carvediloL  (COREG ) 6.25 mg tablet take 1 tablet by mouth 2 times daily with a meal.     chlorthalidone  (HYGROTON ) 25 mg tablet Take 25 mg by mouth in the morning.     valsartan  (DIOVAN ) 320 mg tablet take 1 tablet (320 mg total) by mouth every evening.     cholecalciferol  (VITAMIN D -3) 50 mcg (2,000 unit) tablet Take 2,000 Units by mouth.     diclofenac (VOLTAREN) 1 % topical gel Apply 4 g topically.  esomeprazole  (NexIUM ) 20 mg DR capsule Take 20 mg by mouth every night.     sodium,potassium,mag sulfates 17.5-3.13-1.6 gram recon soln TAKE 1 KIT (354 MLS TOTAL) BY MOUTH ONCE FOR 1 DOSE.     zoledronic acid (RECLAST) 5 mg/100 mL piggyback Infuse 5 mg into a venous catheter.     No current  facility-administered medications on file prior to visit.

## 2024-12-01 ENCOUNTER — Encounter (INDEPENDENT_AMBULATORY_CARE_PROVIDER_SITE_OTHER): Admitting: Ophthalmology

## 2024-12-21 ENCOUNTER — Ambulatory Visit (HOSPITAL_BASED_OUTPATIENT_CLINIC_OR_DEPARTMENT_OTHER): Payer: Self-pay | Admitting: Family Medicine

## 2024-12-27 ENCOUNTER — Ambulatory Visit: Admitting: Dermatology

## 2024-12-29 ENCOUNTER — Ambulatory Visit: Admitting: Dermatology

## 2025-02-15 ENCOUNTER — Ambulatory Visit (HOSPITAL_BASED_OUTPATIENT_CLINIC_OR_DEPARTMENT_OTHER): Admitting: Family Medicine
